# Patient Record
Sex: Female | Born: 1965 | Hispanic: No | Marital: Single | Smoking: Never smoker
Health system: Southern US, Community
[De-identification: ages and names within clinical notes are randomized; demographics above are authoritative.]

## PROBLEM LIST (undated history)

## (undated) DIAGNOSIS — R519 Headache, unspecified: Secondary | ICD-10-CM

## (undated) DIAGNOSIS — J45909 Unspecified asthma, uncomplicated: Secondary | ICD-10-CM

## (undated) DIAGNOSIS — E7800 Pure hypercholesterolemia, unspecified: Secondary | ICD-10-CM

## (undated) DIAGNOSIS — R51 Headache: Secondary | ICD-10-CM

## (undated) DIAGNOSIS — G8929 Other chronic pain: Secondary | ICD-10-CM

## (undated) DIAGNOSIS — E039 Hypothyroidism, unspecified: Secondary | ICD-10-CM

## (undated) DIAGNOSIS — K5790 Diverticulosis of intestine, part unspecified, without perforation or abscess without bleeding: Secondary | ICD-10-CM

## (undated) HISTORY — PX: SLEEVE GASTROPLASTY: SHX1101

## (undated) HISTORY — PX: OTHER SURGICAL HISTORY: SHX169

## (undated) HISTORY — DX: Headache, unspecified: R51.9

## (undated) HISTORY — DX: Pure hypercholesterolemia, unspecified: E78.00

## (undated) HISTORY — DX: Other chronic pain: G89.29

## (undated) HISTORY — DX: Unspecified asthma, uncomplicated: J45.909

## (undated) HISTORY — PX: BARTHOLIN CYST MARSUPIALIZATION: SHX5383

## (undated) HISTORY — DX: Diverticulosis of intestine, part unspecified, without perforation or abscess without bleeding: K57.90

## (undated) HISTORY — DX: Hypothyroidism, unspecified: E03.9

## (undated) HISTORY — DX: Headache: R51

---

## 2006-03-04 ENCOUNTER — Other Ambulatory Visit: Admission: RE | Admit: 2006-03-04 | Discharge: 2006-03-04 | Payer: Self-pay | Admitting: Gynecology

## 2007-03-29 ENCOUNTER — Other Ambulatory Visit: Admission: RE | Admit: 2007-03-29 | Discharge: 2007-03-29 | Payer: Self-pay | Admitting: Gynecology

## 2008-12-01 ENCOUNTER — Ambulatory Visit: Payer: Self-pay | Admitting: Women's Health

## 2008-12-01 ENCOUNTER — Encounter: Payer: Self-pay | Admitting: Women's Health

## 2008-12-01 ENCOUNTER — Other Ambulatory Visit: Admission: RE | Admit: 2008-12-01 | Discharge: 2008-12-01 | Payer: Self-pay | Admitting: Gynecology

## 2008-12-29 ENCOUNTER — Ambulatory Visit: Payer: Self-pay | Admitting: Women's Health

## 2009-06-28 ENCOUNTER — Encounter: Admission: RE | Admit: 2009-06-28 | Discharge: 2009-06-28 | Payer: Self-pay | Admitting: Nurse Practitioner

## 2010-03-11 ENCOUNTER — Ambulatory Visit: Payer: Self-pay | Admitting: Women's Health

## 2010-03-11 ENCOUNTER — Other Ambulatory Visit: Admission: RE | Admit: 2010-03-11 | Discharge: 2010-03-11 | Payer: Self-pay | Admitting: Gynecology

## 2010-03-22 ENCOUNTER — Ambulatory Visit: Payer: Self-pay | Admitting: Gynecology

## 2010-04-24 ENCOUNTER — Ambulatory Visit
Admission: RE | Admit: 2010-04-24 | Discharge: 2010-04-24 | Payer: Self-pay | Source: Home / Self Care | Attending: Women's Health | Admitting: Women's Health

## 2010-08-22 ENCOUNTER — Ambulatory Visit (INDEPENDENT_AMBULATORY_CARE_PROVIDER_SITE_OTHER): Payer: Managed Care, Other (non HMO) | Admitting: Women's Health

## 2010-08-22 DIAGNOSIS — R350 Frequency of micturition: Secondary | ICD-10-CM

## 2010-08-22 DIAGNOSIS — N898 Other specified noninflammatory disorders of vagina: Secondary | ICD-10-CM

## 2010-08-22 DIAGNOSIS — B373 Candidiasis of vulva and vagina: Secondary | ICD-10-CM

## 2010-08-22 DIAGNOSIS — R82998 Other abnormal findings in urine: Secondary | ICD-10-CM

## 2012-01-07 ENCOUNTER — Other Ambulatory Visit (HOSPITAL_COMMUNITY)
Admission: RE | Admit: 2012-01-07 | Discharge: 2012-01-07 | Disposition: A | Discharge status: Home / Self Care | Payer: 59 | Source: Ambulatory Visit | Attending: Women's Health | Admitting: Women's Health

## 2012-01-07 ENCOUNTER — Ambulatory Visit (INDEPENDENT_AMBULATORY_CARE_PROVIDER_SITE_OTHER): Payer: 59 | Admitting: Women's Health

## 2012-01-07 ENCOUNTER — Encounter: Payer: Self-pay | Admitting: Women's Health

## 2012-01-07 VITALS — BP 108/78 | Ht 65.0 in | Wt 284.0 lb

## 2012-01-07 DIAGNOSIS — Z01419 Encounter for gynecological examination (general) (routine) without abnormal findings: Secondary | ICD-10-CM | POA: Insufficient documentation

## 2012-01-07 DIAGNOSIS — Z6841 Body Mass Index (BMI) 40.0 and over, adult: Secondary | ICD-10-CM

## 2012-01-07 DIAGNOSIS — B009 Herpesviral infection, unspecified: Secondary | ICD-10-CM

## 2012-01-07 DIAGNOSIS — E039 Hypothyroidism, unspecified: Secondary | ICD-10-CM

## 2012-01-07 DIAGNOSIS — E7800 Pure hypercholesterolemia, unspecified: Secondary | ICD-10-CM | POA: Insufficient documentation

## 2012-01-07 DIAGNOSIS — Z1151 Encounter for screening for human papillomavirus (HPV): Secondary | ICD-10-CM | POA: Insufficient documentation

## 2012-01-07 NOTE — Patient Instructions (Signed)

## 2012-01-07 NOTE — Progress Notes (Signed)
Erika Robles [birth year] [id number]    History:    The patient presents for annual exam.  Postmenopausal with no bleeding for greater than 4 months, history of an elevated FSH in 2011. Hypothyroid on Synthroid 88 mcg and hypercholesteremia on Simvastin per primary care. 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 History of normal Paps prior. History of normal mammograms. History of a posterior intramural fibroid 24 x 18 mm 04/2010. 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   Past medical history, past surgical history, family history and social history were all reviewed and documented in the EPIC chart. Supervisor at a call center. 2 sisters with diabetes, mother and sister with hypertension. Son [place] 20 doing well.   ROS:  A  ROS was performed and pertinent positives and negatives are included in the history.  Exam:  Filed Vitals:   01/07/12 0933  BP: 108/78    General appearance:  Normal Head/Neck:  Normal, without cervical or supraclavicular adenopathy. Thyroid:  Symmetrical, normal in size, without palpable masses or nodularity. Respiratory  Effort:  Normal  Auscultation:  Clear without wheezing or rhonchi Cardiovascular  Auscultation:  Regular rate, without rubs, murmurs or gallops  Edema/varicosities:  Not grossly evident Abdominal  Soft,nontender, without masses, guarding or rebound.  Liver/spleen:  No organomegaly noted  Hernia:  None appreciated  Skin  Inspection:  Grossly normal  Palpation:  Grossly normal Neurologic/psychiatric  Orientation:  Normal with appropriate conversation.  Mood/affect:  Normal  Genitourinary    Breasts: Examined lying and sitting.     Right: Without masses, retractions, discharge or axillary adenopathy.     Left: Without masses, retractions, discharge or axillary adenopathy.   Inguinal/mons:  Normal without inguinal adenopathy  External genitalia:  Normal  BUS/Urethra/Skene's  glands:  Normal  Bladder:  Normal  Vagina:  Normal  Cervix:  Normal  Uterus:   normal in size, shape and contour.  Midline and mobile  Adnexa/parametria:     Rt: Without masses or tenderness.   Lt: Without masses or tenderness.  Anus and perineum: Normal  Digital rectal exam: Normal sphincter tone without palpated masses or tenderness  Assessment/Plan:  46 y.o. SBF G2 P1 for annual exam with no complaints.  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 SBE's, annual mammogram, increase exercise and decrease calories for weight loss, discussed Weight [employer]. Pap with HR HPV. Reviewed importance of annual exams.    Harrington Challenger [facility], 12:39 PM 01/07/2012

## 2012-01-12 ENCOUNTER — Encounter: Payer: Self-pay | Admitting: Women's Health

## 2012-05-26 ENCOUNTER — Ambulatory Visit (INDEPENDENT_AMBULATORY_CARE_PROVIDER_SITE_OTHER): Payer: 59 | Admitting: Women's Health

## 2012-05-26 ENCOUNTER — Encounter: Payer: Self-pay | Admitting: Women's Health

## 2012-05-26 DIAGNOSIS — N390 Urinary tract infection, site not specified: Secondary | ICD-10-CM | POA: Insufficient documentation

## 2012-05-26 DIAGNOSIS — R300 Dysuria: Secondary | ICD-10-CM

## 2012-05-26 LAB — URINALYSIS W MICROSCOPIC + REFLEX CULTURE
Glucose, UA: NEGATIVE mg/dL
Protein, ur: 100 mg/dL — AB
Urobilinogen, UA: 0.2 mg/dL (ref 0.0–1.0)

## 2012-05-26 MED ORDER — SULFAMETHOXAZOLE-TRIMETHOPRIM 800-160 MG PO TABS: 1.0000 | ORAL_TABLET | Freq: Two times a day (BID) | ORAL | Status: DC

## 2012-05-26 NOTE — Patient Instructions (Addendum)
Urinary Tract Infection Urinary tract infections (UTIs) can develop anywhere along your urinary tract. Your urinary tract is your body's drainage system for removing wastes and extra water. Your urinary tract includes two kidneys, two ureters, a bladder, and a urethra. Your kidneys are a pair of bean-shaped organs. Each kidney is about the size of your fist. They are located below your ribs, one on each side of your spine. CAUSES Infections are caused by microbes, which are microscopic organisms, including fungi, viruses, and bacteria. These organisms are so small that they can only be seen through a microscope. Bacteria are the microbes that most commonly cause UTIs. SYMPTOMS  Symptoms of UTIs may vary by age and gender of the patient and by the location of the infection. Symptoms in Giovonni Poirier women typically include a frequent and intense urge to urinate and a painful, burning feeling in the bladder or urethra during urination. Older women and men are more likely to be tired, shaky, and weak and have muscle aches and abdominal pain. A fever may mean the infection is in your kidneys. Other symptoms of a kidney infection include pain in your back or sides below the ribs, nausea, and vomiting. DIAGNOSIS To diagnose a UTI, your caregiver will ask you about your symptoms. Your caregiver also will ask to provide a urine sample. The urine sample will be tested for bacteria and white blood cells. White blood cells are made by your body to help fight infection. TREATMENT  Typically, UTIs can be treated with medication. Because most UTIs are caused by a bacterial infection, they usually can be treated with the use of antibiotics. The choice of antibiotic and length of treatment depend on your symptoms and the type of bacteria causing your infection. HOME CARE INSTRUCTIONS  If you were prescribed antibiotics, take them exactly as your caregiver instructs you. Finish the medication even if you feel better after you  have only taken some of the medication.  Drink enough water and fluids to keep your urine clear or pale yellow.  Avoid caffeine, tea, and carbonated beverages. They tend to irritate your bladder.  Empty your bladder often. Avoid holding urine for long periods of time.  Empty your bladder before and after sexual intercourse.  After a bowel movement, women should cleanse from front to back. Use each tissue only once. SEEK MEDICAL CARE IF:   You have back pain.  You develop a fever.  Your symptoms do not begin to resolve within 3 days. SEEK IMMEDIATE MEDICAL CARE IF:   You have severe back pain or lower abdominal pain.  You develop chills.  You have nausea or vomiting.  You have continued burning or discomfort with urination. MAKE SURE YOU:   Understand these instructions.  Will watch your condition.  Will get help right away if you are not doing well or get worse. Document Released: 01/15/2005 Document Revised: 10/07/2011 Document Reviewed: 05/16/2011 ExitCare Patient Information 2013 [employer].  

## 2012-05-26 NOTE — Progress Notes (Signed)
Patient ID: Erika Robles, female   DOB: Apr 26, 1965, 47 y.o.   MRN: [id number] Presents with complaint of increased urinary frequency, pressure and slight discomfort at end of stream of urination for 2 days. Reports different odor with urine. Tried cranberry juice, cranberry capsules and cystex without relief. Denies any vaginal discharge or fever. Not sexually active.  Exam: Appears well, no CVAT, UA: Trace leukocytes, 11-20 WBCs, 21-50 RBCs and many bacteria.  UTI with hematuria  Plan: Septra DS twice daily for 3 days #6, prescription, proper use given and reviewed, urine culture pending. Return to office in 2 weeks for test of cure UA due to hematuria. Instructed to call if symptoms persist. UTI prevention discussed.

## 2012-05-28 ENCOUNTER — Other Ambulatory Visit: Payer: Self-pay | Admitting: Women's Health

## 2012-05-28 DIAGNOSIS — R319 Hematuria, unspecified: Secondary | ICD-10-CM

## 2012-05-28 LAB — URINE CULTURE
Colony Count: NO GROWTH
Organism ID, Bacteria: NO GROWTH

## 2012-06-09 ENCOUNTER — Other Ambulatory Visit: Payer: 59

## 2012-06-10 ENCOUNTER — Other Ambulatory Visit: Payer: 59

## 2012-06-11 LAB — URINALYSIS W MICROSCOPIC + REFLEX CULTURE
Bacteria, UA: NONE SEEN
Hgb urine dipstick: NEGATIVE
Ketones, ur: NEGATIVE mg/dL
Leukocytes, UA: NEGATIVE
Nitrite: NEGATIVE
Specific Gravity, Urine: 1.02 (ref 1.005–1.030)
Urobilinogen, UA: 0.2 mg/dL (ref 0.0–1.0)

## 2012-08-30 ENCOUNTER — Ambulatory Visit (INDEPENDENT_AMBULATORY_CARE_PROVIDER_SITE_OTHER): Payer: 59 | Admitting: Women's Health

## 2012-08-30 DIAGNOSIS — R102 Pelvic and perineal pain unspecified side: Secondary | ICD-10-CM

## 2012-08-30 DIAGNOSIS — N390 Urinary tract infection, site not specified: Secondary | ICD-10-CM

## 2012-08-30 DIAGNOSIS — N9489 Other specified conditions associated with female genital organs and menstrual cycle: Secondary | ICD-10-CM

## 2012-08-30 LAB — URINALYSIS W MICROSCOPIC + REFLEX CULTURE
Casts: NONE SEEN
Crystals: NONE SEEN
Glucose, UA: NEGATIVE mg/dL
Nitrite: NEGATIVE
Specific Gravity, Urine: 1.02 (ref 1.005–1.030)
pH: 6 (ref 5.0–8.0)

## 2012-08-30 MED ORDER — SULFAMETHOXAZOLE-TRIMETHOPRIM 800-160 MG PO TABS: 1.0000 | ORAL_TABLET | Freq: Two times a day (BID) | ORAL | Status: DC

## 2012-08-30 NOTE — Progress Notes (Signed)
Patient ID: Erika Robles, female   DOB: Sep 11, 1965, 47 y.o.   MRN: [id number] Presents with complaints of frequency, slight discomfort and pressure with urination and left lower back sided pain for last 5 days. Denies burning, vaginal discharge, or fever. UTI February 2014, septra, resolved. Sexually active/same partner. Perimenopausal, irregular cycles, with ocasional cramping,  LMP Jan 2014, minimal menopausal symptoms.    Exam: Appears well. No CVA tenderness. UA: Trace leukocyte esterases, 7-10 WBC, few bacteria.  UTI Obesity Perimenopausal  Plan: Bactrim DS 800-160 mg twice daily for 3 days prescription, proper use reviewed. Urine culture pending. UTI prevention discussed. Instructed to call if any further bleeding. Encouraged healthy diet and exercise for weight loss and health.

## 2012-09-01 LAB — URINE CULTURE
Colony Count: NO GROWTH
Organism ID, Bacteria: NO GROWTH

## 2012-09-02 ENCOUNTER — Telehealth: Payer: Self-pay | Admitting: *Deleted

## 2012-09-02 NOTE — Telephone Encounter (Signed)
Telephone call, states is feeling somewhat better but not 100 percent. States has been constipated but did have a bowel movement today. Will watch at this time if still continues to not feel well will call back, and we will schedule an ultrasound. Denies any vaginal discharge,  nausea/vomiting or fever

## 2012-09-02 NOTE — Telephone Encounter (Signed)
Pt was given rx for Bactrim DS 800-160 mg twice daily for 3 days on 08/30/12 she took rx, culture came back with no growth. Pt said she still feels funny in abdomen area, feeling pressure and discomfort. # [id number] if needed. Pt said she felt great the next day after taking 1st dose of pills. Please advise

## 2012-09-06 ENCOUNTER — Inpatient Hospital Stay (HOSPITAL_COMMUNITY)
Admission: AD | Admit: 2012-09-06 | Discharge: 2012-09-06 | Disposition: A | Discharge status: Home / Self Care | Payer: 59 | Attending: Obstetrics & Gynecology | Admitting: Obstetrics & Gynecology

## 2012-09-06 ENCOUNTER — Encounter (HOSPITAL_COMMUNITY): Payer: Self-pay | Admitting: *Deleted

## 2012-09-06 DIAGNOSIS — R300 Dysuria: Secondary | ICD-10-CM | POA: Insufficient documentation

## 2012-09-06 DIAGNOSIS — R350 Frequency of micturition: Secondary | ICD-10-CM | POA: Insufficient documentation

## 2012-09-06 DIAGNOSIS — N390 Urinary tract infection, site not specified: Secondary | ICD-10-CM | POA: Insufficient documentation

## 2012-09-06 LAB — URINALYSIS, ROUTINE W REFLEX MICROSCOPIC
Ketones, ur: NEGATIVE mg/dL
Nitrite: POSITIVE — AB
Specific Gravity, Urine: 1.015 (ref 1.005–1.030)
Urobilinogen, UA: 0.2 mg/dL (ref 0.0–1.0)
pH: 6 (ref 5.0–8.0)

## 2012-09-06 LAB — WET PREP, GENITAL: Clue Cells Wet Prep HPF POC: NONE SEEN

## 2012-09-06 LAB — POCT PREGNANCY, URINE: Preg Test, Ur: NEGATIVE

## 2012-09-06 LAB — URINE MICROSCOPIC-ADD ON

## 2012-09-06 MED ORDER — PHENAZOPYRIDINE HCL 200 MG PO TABS: 200.0000 mg | ORAL_TABLET | Freq: Three times a day (TID) | ORAL | Status: DC

## 2012-09-06 MED ORDER — CIPROFLOXACIN HCL 500 MG PO TABS: 500.0000 mg | ORAL_TABLET | Freq: Two times a day (BID) | ORAL | Status: DC

## 2012-09-06 NOTE — MAU Note (Signed)
Pt reports for the last few weeks, she has been constipated. For the last week pt has been feeling like she had a bladder infection, frequency, pain.

## 2012-09-06 NOTE — MAU Provider Note (Signed)
History     CSN: [id number]  Arrival date and time: 09/06/12 0430   First Provider Initiated Contact with Patient 09/06/12 [phone]      Chief Complaint  Patient presents with  . Dysuria  . Urinary Frequency   HPI Erika Robles is a 47 y.o. G1P1001 who presents to MAU today with complaint of urinary discomfort, urgency and frequency. The patient states that she feels a lot of pressure in the lower pelvis. She has also been urinating up to 20 times in a day over the last 3 days. She was seen by her PCP on Monday of last week for the same symptoms and given Bactrim x 3 days. She completed the medication, but has only noticed worsening of her symptoms. She denies fever, CVA tenderness, vaginal bleeding, hematuria or vaginal discharge.   OB History   Grav Para Term Preterm Abortions TAB SAB Ect Mult Living   1 1 1       1       Past Medical History  Diagnosis Date  . Thyroid disease     History reviewed. No pertinent past surgical history.  Family History  Problem Relation Age of Onset  . Hypertension Mother   . Diabetes Sister   . Hypertension Sister     History  Substance Use Topics  . Smoking status: Former Smoker    Types: Cigarettes  . Smokeless tobacco: Not on file  . Alcohol Use: Yes    Allergies: No Known Allergies  Prescriptions prior to admission  Medication Sig Dispense Refill  . ADDERALL XR 15 MG 24 hr capsule       . albuterol (PROVENTIL HFA;VENTOLIN HFA) 108 (90 BASE) MCG/ACT inhaler Inhale 2 puffs into the lungs every 6 (six) hours as needed.      Marland Kitchen levothyroxine (SYNTHROID, LEVOTHROID) 88 MCG tablet       . simvastatin (ZOCOR) 20 MG tablet       . VALTREX 500 MG tablet       . [DISCONTINUED] sulfamethoxazole-trimethoprim (BACTRIM DS) 800-160 MG per tablet Take 1 tablet by mouth 2 (two) times daily.  6 tablet  0    Review of Systems  Constitutional: Negative for fever and malaise/fatigue.  Gastrointestinal: Positive for abdominal pain and  constipation. Negative for nausea, vomiting and diarrhea.  Genitourinary: Positive for dysuria, urgency and frequency. Negative for hematuria and flank pain.       Neg - vaginal bleeding, discharge  Musculoskeletal: Negative for back pain.   Physical Exam   Blood pressure 134/94, pulse 94, temperature 98.4 F (36.9 C), temperature source Oral, resp. rate 18, height 5\' 5"  (1.651 m), weight 286 lb (129.729 kg), last menstrual period 05/06/2012, SpO2 100.00%.  Physical Exam  Constitutional: She is oriented to person, place, and time. She appears well-developed and well-nourished. No distress.  HENT:  Head: Normocephalic and atraumatic.  Cardiovascular: Normal rate, regular rhythm and normal heart sounds.   Respiratory: Effort normal and breath sounds normal. No respiratory distress.  GI: Soft. Bowel sounds are normal. She exhibits no distension and no mass. There is tenderness (mild tenderness to palpation of the lower abdomen). There is no rebound, no guarding and no CVA tenderness.  Genitourinary: There is no rash or tenderness on the right labia. There is no rash or tenderness on the left labia. Uterus is not enlarged and not tender. Cervix exhibits no motion tenderness, no discharge and no friability. Right adnexum displays no mass and no tenderness. Left adnexum displays  no mass and no tenderness. No bleeding around the vagina. No vaginal discharge found.  Neurological: She is alert and oriented to person, place, and time.  Skin: Skin is warm. No erythema.  Psychiatric: She has a normal mood and affect.   Results for orders placed during the hospital encounter of 09/06/12 (from the past 24 hour(s))  URINALYSIS, ROUTINE W REFLEX MICROSCOPIC     Status: Abnormal   Collection Time    09/06/12  4:52 AM      Result Value Range   Color, Urine YELLOW  YELLOW   APPearance TURBID (*) CLEAR   Specific Gravity, Urine 1.015  1.005 - 1.030   pH 6.0  5.0 - 8.0   Glucose, UA NEGATIVE  NEGATIVE  mg/dL   Hgb urine dipstick MODERATE (*) NEGATIVE   Bilirubin Urine NEGATIVE  NEGATIVE   Ketones, ur NEGATIVE  NEGATIVE mg/dL   Protein, ur NEGATIVE  NEGATIVE mg/dL   Urobilinogen, UA 0.2  0.0 - 1.0 mg/dL   Nitrite POSITIVE (*) NEGATIVE   Leukocytes, UA MODERATE (*) NEGATIVE  URINE MICROSCOPIC-ADD ON     Status: Abnormal   Collection Time    09/06/12  4:52 AM      Result Value Range   Squamous Epithelial / LPF FEW (*) RARE   WBC, UA TOO NUMEROUS TO COUNT  <3 WBC/hpf   RBC / HPF 0-2  <3 RBC/hpf   Bacteria, UA MANY (*) RARE  WET PREP, GENITAL     Status: Abnormal   Collection Time    09/06/12  5:40 AM      Result Value Range   Yeast Wet Prep HPF POC NONE SEEN  NONE SEEN   Trich, Wet Prep NONE SEEN  NONE SEEN   Clue Cells Wet Prep HPF POC NONE SEEN  NONE SEEN   WBC, Wet Prep HPF POC FEW (*) NONE SEEN    MAU Course  Procedures None  MDM UA, Wet prep, GC/Chlamydia today  Assessment and Plan  A: UTI  P: Discharge home Rx for Cipro and Pyridium given to the patient Patient encouraged to increase PO hydration Patient advised to follow-up with PCP if symptoms do not resolve Patient may return to MAU as needed or if her condition were to change or worsen  Erika Starr, PA-C  09/06/2012, 6:05 AM

## 2012-09-07 LAB — GC/CHLAMYDIA PROBE AMP: CT Probe RNA: NEGATIVE

## 2012-09-08 LAB — URINE CULTURE: Colony Count: >=100000

## 2012-10-29 ENCOUNTER — Ambulatory Visit: Payer: 59 | Admitting: Gynecology

## 2013-01-07 ENCOUNTER — Encounter: Payer: 59 | Admitting: Women's Health

## 2013-06-30 ENCOUNTER — Encounter: Payer: Self-pay | Admitting: Internal Medicine

## 2013-06-30 ENCOUNTER — Telehealth: Payer: Self-pay | Admitting: *Deleted

## 2013-06-30 ENCOUNTER — Encounter: Payer: Self-pay | Admitting: Women's Health

## 2013-06-30 ENCOUNTER — Other Ambulatory Visit (HOSPITAL_COMMUNITY)
Admission: RE | Admit: 2013-06-30 | Discharge: 2013-06-30 | Disposition: A | Discharge status: Home / Self Care | Payer: 59 | Source: Ambulatory Visit | Attending: Gynecology | Admitting: Gynecology

## 2013-06-30 ENCOUNTER — Ambulatory Visit (INDEPENDENT_AMBULATORY_CARE_PROVIDER_SITE_OTHER): Payer: 59 | Admitting: Women's Health

## 2013-06-30 VITALS — BP 128/82 | Ht 65.0 in | Wt 291.8 lb

## 2013-06-30 DIAGNOSIS — R1032 Left lower quadrant pain: Secondary | ICD-10-CM

## 2013-06-30 DIAGNOSIS — N926 Irregular menstruation, unspecified: Secondary | ICD-10-CM

## 2013-06-30 DIAGNOSIS — G8929 Other chronic pain: Secondary | ICD-10-CM

## 2013-06-30 DIAGNOSIS — R1011 Right upper quadrant pain: Principal | ICD-10-CM

## 2013-06-30 DIAGNOSIS — Z01419 Encounter for gynecological examination (general) (routine) without abnormal findings: Secondary | ICD-10-CM

## 2013-06-30 NOTE — Telephone Encounter (Signed)
Pt informed that appointment on 07/19/13 @ 9:00 am with Dr.Gessner, Dr.Prytle next appointment for new patient was in May.

## 2013-06-30 NOTE — Progress Notes (Signed)
Erika Robles 31-Dec-1965 [id number]    History:    Presents for annual exam.  Cycle every 3-4 months past year, cycles have become increasingly irregular over past several years with increased menopausal symptoms. 2011 LGSIL with negative C&B and ECC. Marland Kitchen Normal mammogram history. Rare HSV outbreaks.  Past medical history, past surgical history, family history and social history were all reviewed and documented in the EPIC chart. Supervisor at a call center. Erika Robles 20 doing well graduating from [employer]. 2 sisters with diabetes. Sister and mother with hypertension.  ROS:  A  ROS was performed and pertinent positives and negatives are included.  Exam:  Filed Vitals:   06/30/13 1105  BP: 128/82    General appearance:  Normal Thyroid:  Symmetrical, normal in size, without palpable masses or nodularity. Respiratory  Auscultation:  Clear without wheezing or rhonchi Cardiovascular  Auscultation:  Regular rate, without rubs, murmurs or gallops  Edema/varicosities:  Not grossly evident Abdominal  Soft,nontender, without masses, guarding or rebound.  Liver/spleen:  No organomegaly noted  Hernia:  None appreciated  Skin  Inspection:  Grossly normal   Breasts: Examined lying and sitting.     Right: Without masses, retractions, discharge or axillary adenopathy.     Left: Without masses, retractions, discharge or axillary adenopathy. Gentitourinary   Inguinal/mons:  Normal without inguinal adenopathy  External genitalia:  Normal  BUS/Urethra/Skene's glands:  Normal  Vagina:  Normal  Cervix:  Normal  Uterus:   normal in size, shape and contour.  Midline and mobile  Adnexa/parametria:     Rt: Without masses or tenderness.   Lt: Without masses or tenderness.  Anus and perineum: Normal  Digital rectal exam: Normal sphincter tone without palpated masses or tenderness  Assessment/Plan:  48 y.o. SBF G1 P1  for annual exam with complaint of right upper quadrant pain under breast, and left  lower quadrant discomfort for many months, intermittent in nature..  Obesity Questionable gallbladder disease Hypothyroid/hypercholesterolemia-primary care manages labs and meds Irregular cycles.  Plan: FSH, UA. Pap,  new screening guidelines reviewed. Referral to GI for evaluation of questionable gallbladder disease and lower left quadrant discomfort. SBE's, continue annual mammogram, calcium rich diet, MVI daily encouraged. Reviewed importance of regular exercise and decreasing calories for weight loss. Condoms encouraged if becomes sexually active.    Huel Cote [facility], 12:57 PM 06/30/2013

## 2013-06-30 NOTE — Patient Instructions (Signed)
Health Recommendations for Postmenopausal Women Respected and ongoing research has looked at the most common causes of death, disability, and poor quality of life in postmenopausal women. The causes include heart disease, diseases of blood vessels, diabetes, depression, cancer, and bone loss (osteoporosis). Many things can be done to help lower the chances of developing these and other common problems: CARDIOVASCULAR DISEASE Heart Disease: A heart attack is a medical emergency. Know the signs and symptoms of a heart attack. Below are things women can do to reduce their risk for heart disease.   Do not smoke. If you smoke, quit.  Aim for a healthy weight. Being overweight causes many preventable deaths. Eat a healthy and balanced diet and drink an adequate amount of liquids.  Get moving. Make a commitment to be more physically active. Aim for 30 minutes of activity on most, if not all days of the week.  Eat for heart health. Choose a diet that is low in saturated fat and cholesterol and eliminate trans fat. Include whole grains, vegetables, and fruits. Read and understand the labels on food containers before buying.  Know your numbers. Ask your caregiver to check your blood pressure, cholesterol (total, HDL, LDL, triglycerides) and blood glucose. Work with your caregiver on improving your entire clinical picture.  High blood pressure. Limit or stop your table salt intake (try salt substitute and food seasonings). Avoid salty foods and drinks. Read labels on food containers before buying. Eating well and exercising can help control high blood pressure. STROKE  Stroke is a medical emergency. Stroke may be the result of a blood clot in a blood vessel in the brain or by a brain hemorrhage (bleeding). Know the signs and symptoms of a stroke. To lower the risk of developing a stroke:  Avoid fatty foods.  Quit smoking.  Control your diabetes, blood pressure, and irregular heart rate. THROMBOPHLEBITIS  (BLOOD CLOT) OF THE LEG  Becoming overweight and leading a stationary lifestyle may also contribute to developing blood clots. Controlling your diet and exercising will help lower the risk of developing blood clots. CANCER SCREENING  Breast Cancer: Take steps to reduce your risk of breast cancer.  You should practice "breast self-awareness." This means understanding the normal appearance and feel of your breasts and should include breast self-examination. Any changes detected, no matter how small, should be reported to your caregiver.  After age 40, you should have a clinical breast exam (CBE) every year.  Starting at age 40, you should consider having a mammogram (breast X-ray) every year.  If you have a family history of breast cancer, talk to your caregiver about genetic screening.  If you are at high risk for breast cancer, talk to your caregiver about having an MRI and a mammogram every year.  Intestinal or Stomach Cancer: Tests to consider are a rectal exam, fecal occult blood, sigmoidoscopy, and colonoscopy. Women who are high risk may need to be screened at an earlier age and more often.  Cervical Cancer:  Beginning at age 30, you should have a Pap test every 3 years as long as the past 3 Pap tests have been normal.  If you have had past treatment for cervical cancer or a condition that could lead to cancer, you need Pap tests and screening for cancer for at least 20 years after your treatment.  If you had a hysterectomy for a problem that was not cancer or a condition that could lead to cancer, then you no longer need Pap tests.    If you are between ages 65 and 70, and you have had normal Pap tests going back 10 years, you no longer need Pap tests.  If Pap tests have been discontinued, risk factors (such as a new sexual partner) need to be reassessed to determine if screening should be resumed.  Some medical problems can increase the chance of getting cervical cancer. In these  cases, your caregiver may recommend more frequent screening and Pap tests.  Uterine Cancer: If you have vaginal bleeding after reaching menopause, you should notify your caregiver.  Ovarian cancer: Other than yearly pelvic exams, there are no reliable tests available to screen for ovarian cancer at this time except for yearly pelvic exams.  Lung Cancer: Yearly chest X-rays can detect lung cancer and should be done on high risk women, such as cigarette smokers and women with chronic lung disease (emphysema).  Skin Cancer: A complete body skin exam should be done at your yearly examination. Avoid overexposure to the sun and ultraviolet light lamps. Use a strong sun block cream when in the sun. All of these things are important in lowering the risk of skin cancer. MENOPAUSE Menopause Symptoms: Hormone therapy products are effective for treating symptoms associated with menopause:  Moderate to severe hot flashes.  Night sweats.  Mood swings.  Headaches.  Tiredness.  Loss of sex drive.  Insomnia.  Other symptoms. Hormone replacement carries certain risks, especially in older women. Women who use or are thinking about using estrogen or estrogen with progestin treatments should discuss that with their caregiver. Your caregiver will help you understand the benefits and risks. The ideal dose of hormone replacement therapy is not known. The Food and Drug Administration (FDA) has concluded that hormone therapy should be used only at the lowest doses and for the shortest amount of time to reach treatment goals.  OSTEOPOROSIS Protecting Against Bone Loss and Preventing Fracture: If you use hormone therapy for prevention of bone loss (osteoporosis), the risks for bone loss must outweigh the risk of the therapy. Ask your caregiver about other medications known to be safe and effective for preventing bone loss and fractures. To guard against bone loss or fractures, the following is recommended:  If  you are less than age 50, take 1000 mg of calcium and at least 600 mg of Vitamin D per day.  If you are greater than age 50 but less than age 70, take 1200 mg of calcium and at least 600 mg of Vitamin D per day.  If you are greater than age 70, take 1200 mg of calcium and at least 800 mg of Vitamin D per day. Smoking and excessive alcohol intake increases the risk of osteoporosis. Eat foods rich in calcium and vitamin D and do weight bearing exercises several times a week as your caregiver suggests. DIABETES Diabetes Melitus: If you have Type I or Type 2 diabetes, you should keep your blood sugar under control with diet, exercise and recommended medication. Avoid too many sweets, starchy and fatty foods. Being overweight can make control more difficult. COGNITION AND MEMORY Cognition and Memory: Menopausal hormone therapy is not recommended for the prevention of cognitive disorders such as Alzheimer's disease or memory loss.  DEPRESSION  Depression may occur at any age, but is common in elderly women. The reasons may be because of physical, medical, social (loneliness), or financial problems and needs. If you are experiencing depression because of medical problems and control of symptoms, talk to your caregiver about this. Physical activity and   exercise may help with mood and sleep. Community and volunteer involvement may help your sense of value and worth. If you have depression and you feel that the problem is getting worse or becoming severe, talk to your caregiver about treatment options that are best for you. ACCIDENTS  Accidents are common and can be serious in the elderly woman. Prepare your house to prevent accidents. Eliminate throw rugs, place hand bars in the bath, shower and toilet areas. Avoid wearing high heeled shoes or walking on wet, snowy, and icy areas. Limit or stop driving if you have vision or hearing problems, or you feel you are unsteady with you movements and  reflexes. HEPATITIS C Hepatitis C is a type of viral infection affecting the liver. It is spread mainly through contact with blood from an infected person. It can be treated, but if left untreated, it can lead to severe liver damage over years. Many people who are infected do not know that the virus is in their blood. If you are a "baby-boomer", it is recommended that you have one screening test for Hepatitis C. IMMUNIZATIONS  Several immunizations are important to consider having during your senior years, including:   Tetanus, diptheria, and pertussis booster shot.  Influenza every year before the flu season begins.  Pneumonia vaccine.  Shingles vaccine.  Others as indicated based on your specific needs. Talk to your caregiver about these. Document Released: 05/30/2005 Document Revised: 03/24/2012 Document Reviewed: 01/24/2008 Exit[employer] Patient Information 2014 [employer].  

## 2013-06-30 NOTE — Telephone Encounter (Signed)
Message copied by Thamas Jaegers on Thu Jun 30, 2013  2:19 PM ------      Message from: [place], [place] J      Created: Thu Jun 30, 2013 12:00 PM       Please schedule appt with Dr Hilarie Fredrickson at [facility] for eval of rt upper pain possible GB, and possible colonoscopy for llq discomfort months. ------

## 2013-07-01 ENCOUNTER — Telehealth: Payer: Self-pay | Admitting: Women's Health

## 2013-07-01 LAB — FOLLICLE STIMULATING HORMONE: FSH: 39.4 m[IU]/mL

## 2013-07-01 NOTE — Telephone Encounter (Signed)
Message on cell [facility] elevated/menopausal range, may not have anymore cycles. Has had irregular cycles this past year.

## 2013-07-19 ENCOUNTER — Ambulatory Visit (INDEPENDENT_AMBULATORY_CARE_PROVIDER_SITE_OTHER): Payer: 59 | Admitting: Internal Medicine

## 2013-07-19 ENCOUNTER — Encounter: Payer: Self-pay | Admitting: Internal Medicine

## 2013-07-19 VITALS — BP 118/80 | HR 88 | Ht 65.0 in | Wt 289.1 lb

## 2013-07-19 DIAGNOSIS — R198 Other specified symptoms and signs involving the digestive system and abdomen: Secondary | ICD-10-CM

## 2013-07-19 DIAGNOSIS — R194 Change in bowel habit: Secondary | ICD-10-CM

## 2013-07-19 DIAGNOSIS — R1011 Right upper quadrant pain: Secondary | ICD-10-CM

## 2013-07-19 MED ORDER — NA SULFATE-K SULFATE-MG SULF 17.5-3.13-1.6 GM/177ML PO SOLN: ORAL | Status: DC

## 2013-07-19 NOTE — Progress Notes (Signed)
Subjective:    Patient ID: Erika Robles, female    DOB: Feb 02, 1966, 48 y.o.   MRN: [id number]  HPI This is a very nice lady who has been having some right-sided abdominal pain. She's had some tenderness there felt on recent GYN exam. GI referral was made. The patient reports that she does have constipation and progressively more narrow stools over the last couple of years with some left lower quadrant pain and frequent noises in that area with bowel sounds. She tried some citrated magnesia recently when she didn't go to the bathroom for about 5 days. She has not really using a regular laxative otherwise. She has increased fiber in her diet. The right upper quadrant pain is intermittent for the last year, it is not necessarily postprandial. It does not seem to radiate to the back. She says that when she has increased fiber to a point, or even tried a fiber supplement like Citrucel she actually got constipated. She has not had any rectal bleeding. Sometimes she has a pain or pressure radiating up into her chest. No Known Allergies Outpatient Prescriptions Prior to Visit  Medication Sig Dispense Refill  . ADDERALL XR 15 MG 24 hr capsule Take 15 mg by mouth daily.       Marland Kitchen albuterol (PROVENTIL HFA;VENTOLIN HFA) 108 (90 BASE) MCG/ACT inhaler Inhale 2 puffs into the lungs every 6 (six) hours as needed.      Marland Kitchen levothyroxine (SYNTHROID, LEVOTHROID) 100 MCG tablet Take 100 mcg by mouth daily before breakfast.       No facility-administered medications prior to visit.   Past Medical History  Diagnosis Date  . Hypothyroidism     hypothyroidism  . Obesity   . Hypercholesteremia   . HSV (herpes simplex virus) infection   . Chronic headaches    Past Surgical History  Procedure Laterality Date  . Bartholin cyst marsupialization     History   Social History  . Marital Status: Single    Spouse Name: N/A    Number of Children: 1  . Years of Education: N/A   Occupational History  .  manager [employer]   Social History Main Topics  . Smoking status: Former Smoker    Types: Cigarettes    Quit date: 04/21/1988  . Smokeless tobacco: Never Used  . Alcohol Use: Yes     Comment: 0-1 per day  . Drug Use: No  . 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 One caffeinated beverage daily. Updated 07/19/2013   Family History  Problem Relation Age of Onset  . Hypertension Mother   . Diabetes Sister   . Hypertension Sister   . Colon polyps Father   . Lung cancer Maternal Grandfather     smoker  . Prostate cancer Paternal Grandfather     Review of Systems Positive for those things mentioned above, allergies, back pain and some headaches. All other review of systems negative or as per history of present illness.    Objective:   Physical Exam General:  Well-developed, well-nourished and in no acute distress - very obese Eyes:  anicteric. ENT:   Mouth and posterior pharynx free of lesions.  Neck:   supple w/o thyromegaly or mass.  Lungs: Clear to auscultation bilaterally. Chest wall: Ribs non tender Heart:  S1S2, no rubs, murmurs, gallops. Abdomen:  soft, non-tender, no hepatosplenomegaly, hernia, or mass and BS+.  Rectal: deferred Lymph:  no cervical or supraclavicular adenopathy. Extremities:   no edema Skin   no rash. Neuro:  A&O x 3.  Psych:  appropriate mood and  Affect.   Data Reviewed: GYN notes    Assessment & Plan:  Change in bowel habits and some left lower quadrant pain  RUQ pain  Schedule colonoscopy to evaluate change in bowel habits with smaller stools and constipation. The risks and benefits as well as alternatives of endoscopic procedure(s) have been discussed and reviewed. All questions answered. The patient agrees to proceed.  Continue to increase fiber - add MiraLax if needed  EM:[id number],OFHQRF, MD and Elon Alas, NP

## 2013-07-19 NOTE — Patient Instructions (Addendum)
You have been scheduled for a colonoscopy with propofol. Please follow written instructions given to you at your visit today.  Please pick up your prep kit at the pharmacy within the next 1-3 days. If you use inhalers (even only as needed), please bring them with you on the day of your procedure. Your physician has requested that you go to www.startemmi.com and enter the access code given to you at your visit today. This web site gives a general overview about your procedure. However, you should still follow specific instructions given to you by our office regarding your preparation for the procedure.   Increase the fiber in your diet, if this doesn't help enough then add over the counter Miralax and use as needed.  Coupon provided today for the Miralax.   I appreciate the opportunity to care for you.

## 2013-07-25 ENCOUNTER — Encounter: Payer: Self-pay | Admitting: Internal Medicine

## 2013-08-26 ENCOUNTER — Encounter: Payer: Self-pay | Admitting: Internal Medicine

## 2013-08-26 ENCOUNTER — Ambulatory Visit (AMBULATORY_SURGERY_CENTER): Payer: 59 | Admitting: Internal Medicine

## 2013-08-26 VITALS — BP 121/71 | HR 88 | Temp 97.3°F | Resp 20 | Ht 65.0 in | Wt 289.0 lb

## 2013-08-26 DIAGNOSIS — R198 Other specified symptoms and signs involving the digestive system and abdomen: Secondary | ICD-10-CM

## 2013-08-26 DIAGNOSIS — K5730 Diverticulosis of large intestine without perforation or abscess without bleeding: Secondary | ICD-10-CM

## 2013-08-26 MED ORDER — SODIUM CHLORIDE 0.9 % IV SOLN: 500.0000 mL | INTRAVENOUS | Status: DC

## 2013-08-26 NOTE — Op Note (Signed)
[place] [place]  [address] [place], [address]   COLONOSCOPY PROCEDURE REPORT  PATIENT: Erika Robles, Erika Robles  MR#: [id number] BIRTHDATE: 06-Feb-1966 , 47  yrs. old GENDER: Female ENDOSCOPIST: Gatha Mayer, MD, [facility] PROCEDURE DATE:  08/26/2013 PROCEDURE:   Colonoscopy, diagnostic First Screening Colonoscopy - Avg.  risk and is 50 yrs.  old or older - No.  Prior Negative Screening - Now for repeat screening. N/A  History of Adenoma - Now for follow-up colonoscopy & has been > or = to 3 yrs.  N/A  Polyps Removed Today? No.  Recommend repeat exam, <10 yrs? No. ASA CLASS:   Class II INDICATIONS:Change in bowel habits. MEDICATIONS: propofol (Diprivan) 300mg  IV, MAC sedation, administered by CRNA, and These medications were titrated to patient response per physician's verbal order  DESCRIPTION OF PROCEDURE:   After the risks benefits and alternatives of the procedure were thoroughly explained, informed consent was obtained.  A digital rectal exam revealed no abnormalities of the rectum.   The LB PFC-H190 [id number]  endoscope was introduced through the anus and advanced to the cecum, which was identified by both the appendix and ileocecal valve. No adverse events experienced.   The quality of the prep was excellent using Suprep  The instrument was then slowly withdrawn as the colon was fully examined.  COLON FINDINGS: Severe diverticulosis was noted The finding was in the left colon.   Mild diverticulosis was noted The finding was in the right colon.   The colon mucosa was otherwise normal.   A right colon retroflexion was performed.  Retroflexed views revealed no abnormalities. The time to cecum=1 minutes 50 seconds.  Withdrawal time=6 minutes 48 seconds.  The scope was withdrawn and the procedure completed. COMPLICATIONS: There were no complications.  ENDOSCOPIC IMPRESSION: 1.   Severe diverticulosis was noted in the left colon 2.   Mild diverticulosis was noted in  the right colon 3.   The colon mucosa was otherwise normal  RECOMMENDATIONS: Repeat colonoscopy 10 years - 2025   eSigned:  Gatha Mayer, MD, [facility] 08/26/2013 3:23 PM   cc: The Patient and Herbert Pun, MD

## 2013-08-26 NOTE — Patient Instructions (Addendum)
You have diverticulosis - thickened muscle rings and pouches in the colon wall. Please read the handout about this condition.  No polyps or cancer were seen.  Next routine colonoscopy in 10 years - 2025  I appreciate the opportunity to care for you. Gatha Mayer, MD, [facility]   Discharge instructions given with verbal understanding. Handout on diverticulosis. Resume previous medications. YOU HAD AN ENDOSCOPIC PROCEDURE TODAY AT [facility]: Refer to the procedure report that was given to you for any specific questions about what was found during the examination.  If the procedure report does not answer your questions, please call your gastroenterologist to clarify.  If you requested that your care partner not be given the details of your procedure findings, then the procedure report has been included in a sealed envelope for you to review at your convenience later.  YOU SHOULD EXPECT: Some feelings of bloating in the abdomen. Passage of more gas than usual.  Walking can help get rid of the air that was put into your GI tract during the procedure and reduce the bloating. If you had a lower endoscopy (such as a colonoscopy or flexible sigmoidoscopy) you may notice spotting of blood in your stool or on the toilet paper. If you underwent a bowel prep for your procedure, then you may not have a normal bowel movement for a few days.  DIET: Your first meal following the procedure should be a light meal and then it is ok to progress to your normal diet.  A half-sandwich or bowl of soup is an example of a good first meal.  Heavy or fried foods are harder to digest and may make you feel nauseous or bloated.  Likewise meals heavy in dairy and vegetables can cause extra gas to form and this can also increase the bloating.  Drink plenty of fluids but you should avoid alcoholic beverages for 24 hours.  ACTIVITY: Your care partner should take you home directly after the procedure.  You should  plan to take it easy, moving slowly for the rest of the day.  You can resume normal activity the day after the procedure however you should NOT DRIVE or use heavy machinery for 24 hours (because of the sedation medicines used during the test).    SYMPTOMS TO REPORT IMMEDIATELY: A gastroenterologist can be reached at any hour.  During normal business hours, 8:30 AM to 5:00 PM Monday through Friday, call [phone].  After hours and on weekends, please call the GI answering service at [phone] who will take a message and have the physician on call contact you.   Following lower endoscopy (colonoscopy or flexible sigmoidoscopy):  Excessive amounts of blood in the stool  Significant tenderness or worsening of abdominal pains  Swelling of the abdomen that is new, acute  Fever of 100F or higher  FOLLOW UP: If any biopsies were taken you will be contacted by phone or by letter within the next 1-3 weeks.  Call your gastroenterologist if you have not heard about the biopsies in 3 weeks.  Our staff will call the home number listed on your records the next business day following your procedure to check on you and address any questions or concerns that you may have at that time regarding the information given to you following your procedure. This is a courtesy call and so if there is no answer at the home number and we have not heard from you through the emergency physician on call,  we will assume that you have returned to your regular daily activities without incident.  SIGNATURES/CONFIDENTIALITY: You and/or your care partner have signed paperwork which will be entered into your electronic medical record.  These signatures attest to the fact that that the information above on your After Visit Summary has been reviewed and is understood.  Full responsibility of the confidentiality of this discharge information lies with you and/or your care-partner.

## 2013-08-29 ENCOUNTER — Telehealth: Payer: Self-pay | Admitting: *Deleted

## 2013-08-29 NOTE — Telephone Encounter (Signed)
No answer. Message stating mailbox unable to accept messages at this time.

## 2014-02-20 ENCOUNTER — Encounter: Payer: Self-pay | Admitting: Internal Medicine

## 2015-06-04 ENCOUNTER — Encounter: Payer: Self-pay | Admitting: Women's Health

## 2015-06-04 ENCOUNTER — Other Ambulatory Visit (HOSPITAL_COMMUNITY)
Admission: RE | Admit: 2015-06-04 | Discharge: 2015-06-04 | Disposition: A | Discharge status: Home / Self Care | Payer: 59 | Source: Ambulatory Visit | Attending: Women's Health | Admitting: Women's Health

## 2015-06-04 ENCOUNTER — Ambulatory Visit (INDEPENDENT_AMBULATORY_CARE_PROVIDER_SITE_OTHER): Payer: 59 | Admitting: Women's Health

## 2015-06-04 VITALS — BP 124/80 | Ht 65.0 in | Wt 318.0 lb

## 2015-06-04 DIAGNOSIS — Z1151 Encounter for screening for human papillomavirus (HPV): Secondary | ICD-10-CM | POA: Diagnosis not present

## 2015-06-04 DIAGNOSIS — Z01419 Encounter for gynecological examination (general) (routine) without abnormal findings: Secondary | ICD-10-CM | POA: Diagnosis present

## 2015-06-04 NOTE — Patient Instructions (Signed)

## 2015-06-04 NOTE — Progress Notes (Signed)
Erika Robles [birth year] [id number]    History:    Presents for annual exam.  Postmenopausal with no bleeding for greater than one year with elevated FSH. No HRT menopausal symptoms manageable. 2011 LGSIL with a negative C&B with normal Paps after. HSV rare outbreaks. Normal mammogram history. Morbid obesity. Hypothyroid and hypercholesterolemia managed by primary care. 2015 colonoscopy showed diverticulosis.  Past medical history, past surgical history, family history and social history were all reviewed and documented in the EPIC chart. Desk job. Erika Robles 44 graduated college and doing well. 2 sisters with diabetes, sister mother with hypertension.  ROS:  A ROS was performed and pertinent positives and negatives are included.  Exam:  Filed Vitals:   06/04/15 1046  BP: 124/80    General appearance:  Normal Thyroid:  Symmetrical, normal in size, without palpable masses or nodularity. Respiratory  Auscultation:  Clear without wheezing or rhonchi Cardiovascular  Auscultation:  Regular rate, without rubs, murmurs or gallops  Edema/varicosities:  Not grossly evident Abdominal  Soft,nontender, without masses, guarding or rebound.  Liver/spleen:  No organomegaly noted  Hernia:  None appreciated  Skin  Inspection:  Grossly normal   Breasts: Examined lying and sitting.     Right: Without masses, retractions, discharge or axillary adenopathy.     Left: Without masses, retractions, discharge or axillary adenopathy. Gentitourinary   Inguinal/mons:  Normal without inguinal adenopathy  External genitalia:  Normal  BUS/Urethra/Skene's glands:  Normal  Vagina:  Normal  Cervix:  Normal  Uterus:  normal in size, shape and contour.  Midline and mobile  Adnexa/parametria:     Rt: Without masses or tenderness.   Lt: Without masses or tenderness.  Anus and perineum: Normal  Digital rectal exam: Normal sphincter tone without palpated masses or tenderness  Assessment/Plan:  50 y.o. SBF G1  P1 for annual exam with no complaints.  Postmenopausal/no bleeding/no HRT/not sexually active 2011 LGSIL with normal Paps after HSV-rare outbreaks Hypothyroidism/hypercholesterolemia-primary care manages labs and meds Morbid obesity  Plan: SBE's, instructed to have [facility] send copy of mammogram results to our office as well as primary care. Reviewed importance of increasing regular exercise and decreasing calories for weight loss. Weight loss surgery also reviewed. Weight is up 30 pounds from last year. Condoms encouraged if becomes sexually active. Pap with HR HPV typing, new screening guidelines reviewed. Denies need for Valtrex.    Huel Cote [facility], 11:39 AM 06/04/2015

## 2015-06-05 LAB — URINALYSIS W MICROSCOPIC + REFLEX CULTURE
Bilirubin Urine: NEGATIVE
CASTS: NONE SEEN [LPF]
GLUCOSE, UA: NEGATIVE
Hgb urine dipstick: NEGATIVE
Ketones, ur: NEGATIVE
Leukocytes, UA: NEGATIVE
NITRITE: NEGATIVE
PH: 5.5 (ref 5.0–8.0)
Protein, ur: NEGATIVE
RBC / HPF: NONE SEEN RBC/HPF (ref <=2)
SPECIFIC GRAVITY, URINE: 1.026 (ref 1.001–1.035)
Yeast: NONE SEEN [HPF]

## 2015-06-05 LAB — CYTOLOGY - PAP

## 2015-06-06 LAB — URINE CULTURE: Colony Count: 7000

## 2015-08-20 DIAGNOSIS — L500 Allergic urticaria: Secondary | ICD-10-CM | POA: Insufficient documentation

## 2015-08-20 DIAGNOSIS — J302 Other seasonal allergic rhinitis: Secondary | ICD-10-CM | POA: Insufficient documentation

## 2015-11-22 DIAGNOSIS — J45909 Unspecified asthma, uncomplicated: Secondary | ICD-10-CM | POA: Insufficient documentation

## 2016-07-10 ENCOUNTER — Encounter: Payer: Self-pay | Admitting: Women's Health

## 2016-07-10 ENCOUNTER — Ambulatory Visit (INDEPENDENT_AMBULATORY_CARE_PROVIDER_SITE_OTHER): Payer: Managed Care, Other (non HMO) | Admitting: Women's Health

## 2016-07-10 VITALS — BP 122/84 | Ht 65.0 in | Wt 322.6 lb

## 2016-07-10 DIAGNOSIS — Z01419 Encounter for gynecological examination (general) (routine) without abnormal findings: Secondary | ICD-10-CM | POA: Diagnosis not present

## 2016-07-10 DIAGNOSIS — B009 Herpesviral infection, unspecified: Secondary | ICD-10-CM | POA: Diagnosis not present

## 2016-07-10 MED ORDER — VALACYCLOVIR HCL 500 MG PO TABS: ORAL_TABLET | ORAL | 12 refills | Status: DC

## 2016-07-10 NOTE — Progress Notes (Signed)
Erika Robles [birth year] [id number]    History:    Presents for annual exam. Postmenopausal/ no bleeding/ no HRT/not sexually active. Normal pap and mammogram history. Morbid obesity, in program for weight loss surgery/gastric sleeve . Hypothyroid and hypercholesterolemia managed by primary care. 2015 colonoscopy showed diverticulosis. Mammogram done this year with [facility], reports as normal.   Past medical history, past surgical history, family history and social history were all reviewed and documented in the EPIC chart. 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 Two sisters with diabetes, sister mother with hypertension.  ROS:  A ROS was performed and pertinent positives and negatives are included.  Exam:  Vitals:   07/10/16 0827  BP: 122/84  Weight: (!) 322 lb 9.6 oz (146.3 kg)  Height: 5\' 5"  (1.651 m)   Body mass index is 53.68 kg/m.   General appearance:  Normal Thyroid:  Symmetrical, normal in size, without palpable masses or nodularity. Respiratory  Auscultation:  Clear without wheezing or rhonchi Cardiovascular  Auscultation:  Regular rate, without rubs, murmurs or gallops  Edema/varicosities:  Not grossly evident Abdominal  Soft,nontender, without masses, guarding or rebound.  Liver/spleen:  No organomegaly noted  Hernia:  None appreciated  Skin  Inspection:  Grossly normal   Breasts: Examined lying and sitting. Pendulous    Right: Without masses, retractions, discharge or axillary adenopathy.     Left: Without masses, retractions, discharge or axillary adenopathy. Gentitourinary   Inguinal/mons:  Normal without inguinal adenopathy  External genitalia:  Normal  BUS/Urethra/Skene's glands:  Normal  Vagina:  Normal  Cervix:  Normal  Uterus:   normal in size, shape and contour.  Midline and mobile  Adnexa/parametria:     Rt: Without masses or tenderness.   Lt: Without masses or tenderness.  Anus and perineum: Normal  Digital rectal exam: Normal sphincter tone  without palpated masses or tenderness  Assessment/Plan:  51 y.o.  for annual exam. SBF, G1P1 with no complaints.  Postmenopausal, no bleeding,on no HRT Hypothyroidism/Hypercholesterolemia-primary care manages labs and medication Obesity HSV rare outbreaks  Plan: Discussed and reviewed the advantages and risks of weight loss surgery, will keep scheduled followup. Healthy lifestyle, diet and exercise encouraged SBE's, continue annual screening mammogram done at [facility], instructed to send a copy. instructed to use condoms if become sexually active. Vitamin D 2000 daily encouraged. Valtrex 500 twice daily for 3-5 days when necessary. Pap normal with negative HR HPV 2017, new screening guidelines reviewed.    Huel Cote [facility], 8:49 AM 07/10/2016

## 2016-07-10 NOTE — Patient Instructions (Signed)
Sleeve Gastrectomy, Care After Refer to this sheet in the next few weeks. These instructions provide you with information about caring for yourself after your procedure. Your health care provider may also give you more specific instructions. Your treatment has been planned according to current medical practices, but problems sometimes occur. Call your health care provider if you have any problems or questions after your procedure. What can I expect after the procedure? After the procedure, it is common to have:  Pain in your abdomen.  Decreased appetite.  Clear fluid leaking through the small tube (drain) that comes from your incision site. Follow these instructions at home: Medicines   Take over-the-counter and prescription medicines only as told by your health care provider.  Do not drive for 24 hours if you received a sedative.  Do not drive or operate heavy machinery while taking prescription pain medicine. Incision and drain care    Follow instructions from your health care provider about how to take care of your incisions. Make sure you:  Wash your hands with soap and water before you change your bandage (dressing). If soap and water are not available, use hand sanitizer.  Change your dressing as told by your health care provider.  Leave stitches (sutures), skin glue, or adhesive strips in place. These skin closures may need to be in place for 2 weeks or longer. If adhesive strip edges start to loosen and curl up, you may trim the loose edges. Do not remove adhesive strips completely unless your health care provider tells you to do that.  Keep the area around your incisions and your drain clean and dry.  Check your incision areas every day for signs of infection. Check for:  More redness, swelling, or pain.  More fluid or blood.  Warmth.  Pus or a bad smell.  Empty your drain every day. Follow instructions from your health care provider about recording the amount of  fluid that comes from your drain. Make note of any changes in the amount or appearance of the fluid. Activity   Return to your normal activities as told by your health care provider. Ask your health care provider what activities are safe for you.  Do not lift anything that is heavier than 10 lb (4.5 kg).  Avoid intense physical activity for as long as told by your health care provider.  Move around at least once per day, every day. As you start to feel better, you may start to exercise more. Eating and drinking   Follow instructions from your health care provider about eating or drinking restrictions. You will be given instructions about the type, the size, and the timing of your meals.  Keep track of any foods that cause discomfort, such as bloating or cramping.  Eat healthy foods. Avoid foods that are high in fat or sugar.  Stop eating when you feel full.  Take supplements only as told by your health care provider.  Drink enough fluid to keep your urine clear or pale yellow. General instructions   Do not take baths, swim, or use a hot tub until your health care provider approves. Ask your health care provider if you can take showers. You may only be allowed to take sponge baths for bathing.  Do not use tobacco products, including cigarettes, chewing tobacco, or e-cigarettes. If you need help quitting, ask your health care provider.  Wear compression stockings as told by your health care provider. These stockings help to prevent blood clots and reduce swelling  in your legs.  Do breathing exercises as told by your health care provider.  Keep all follow-up visits as told by your health care provider. This is important. Contact a health care provider if:  You have pain that gets worse or does not get better with medicine.  You have more redness, swelling, or pain around your incisions.  You have more fluid or blood coming from your incisions.  Your incisions feel warm to the  touch.  You have pus or a bad smell coming from your incisions.  You have a fever or chills.  You have problems with your drain.  You have green or bad-smelling fluid leaking from your drain. Get help right away if:  You have difficulty breathing.  You have severe pain, especially in your legs. This information is not intended to replace advice given to you by your health care provider. Make sure you discuss any questions you have with your health care provider. Document Released: 02/01/2009 Document Revised: 12/02/2015 Document Reviewed: 09/29/2014 Elsevier Interactive Patient Education  2017 [place] Maintenance for Postmenopausal Women Menopause is a normal process in which your reproductive ability comes to an end. This process happens gradually over a span of months to years, usually between the ages of 62 and 39. Menopause is complete when you have missed 12 consecutive menstrual periods. It is important to talk with your health care provider about some of the most common conditions that affect postmenopausal women, such as heart disease, cancer, and bone loss (osteoporosis). Adopting a healthy lifestyle and getting preventive care can help to promote your health and wellness. Those actions can also lower your chances of developing some of these common conditions. What should I know about menopause? During menopause, you may experience a number of symptoms, such as:  Moderate-to-severe hot flashes.  Night sweats.  Decrease in sex drive.  Mood swings.  Headaches.  Tiredness.  Irritability.  Memory problems.  Insomnia. Choosing to treat or not to treat menopausal changes is an individual decision that you make with your health care provider. What should I know about hormone replacement therapy and supplements? Hormone therapy products are effective for treating symptoms that are associated with menopause, such as hot flashes and night sweats. Hormone  replacement carries certain risks, especially as you become older. If you are thinking about using estrogen or estrogen with progestin treatments, discuss the benefits and risks with your health care provider. What should I know about heart disease and stroke? Heart disease, heart attack, and stroke become more likely as you age. This may be due, in part, to the hormonal changes that your body experiences during menopause. These can affect how your body processes dietary fats, triglycerides, and cholesterol. Heart attack and stroke are both medical emergencies. There are many things that you can do to help prevent heart disease and stroke:  Have your blood pressure checked at least every 1-2 years. High blood pressure causes heart disease and increases the risk of stroke.  If you are 79-7 years old, ask your health care provider if you should take aspirin to prevent a heart attack or a stroke.  Do not use any tobacco products, including cigarettes, chewing tobacco, or electronic cigarettes. If you need help quitting, ask your health care provider.  It is important to eat a healthy diet and maintain a healthy weight.  Be sure to include plenty of vegetables, fruits, low-fat dairy products, and lean protein.  Avoid eating foods that are high in solid  fats, added sugars, or salt (sodium).  Get regular exercise. This is one of the most important things that you can do for your health.  Try to exercise for at least 150 minutes each week. The type of exercise that you do should increase your heart rate and make you sweat. This is known as moderate-intensity exercise.  Try to do strengthening exercises at least twice each week. Do these in addition to the moderate-intensity exercise.  Know your numbers.Ask your health care provider to check your cholesterol and your blood glucose. Continue to have your blood tested as directed by your health care provider. What should I know about cancer  screening? There are several types of cancer. Take the following steps to reduce your risk and to catch any cancer development as early as possible. Breast Cancer  Practice breast self-awareness.  This means understanding how your breasts normally appear and feel.  It also means doing regular breast self-exams. Let your health care provider know about any changes, no matter how small.  If you are 3 or older, have a clinician do a breast exam (clinical breast exam or CBE) every year. Depending on your age, family history, and medical history, it may be recommended that you also have a yearly breast X-ray (mammogram).  If you have a family history of breast cancer, talk with your health care provider about genetic screening.  If you are at high risk for breast cancer, talk with your health care provider about having an MRI and a mammogram every year.  Breast cancer (BRCA) gene test is recommended for women who have family members with BRCA-related cancers. Results of the assessment will determine the need for genetic counseling and BRCA1 and for BRCA2 testing. BRCA-related cancers include these types:  Breast. This occurs in males or females.  Ovarian.  Tubal. This may also be called fallopian tube cancer.  Cancer of the abdominal or pelvic lining (peritoneal cancer).  Prostate.  Pancreatic. Cervical, Uterine, and Ovarian Cancer  Your health care provider may recommend that you be screened regularly for cancer of the pelvic organs. These include your ovaries, uterus, and vagina. This screening involves a pelvic exam, which includes checking for microscopic changes to the surface of your cervix (Pap test).  For women ages 21-65, health care providers may recommend a pelvic exam and a Pap test every three years. For women ages 68-65, they may recommend the Pap test and pelvic exam, combined with testing for human papilloma virus (HPV), every five years. Some types of HPV increase your  risk of cervical cancer. Testing for HPV may also be done on women of any age who have unclear Pap test results.  Other health care providers may not recommend any screening for nonpregnant women who are considered low risk for pelvic cancer and have no symptoms. Ask your health care provider if a screening pelvic exam is right for you.  If you have had past treatment for cervical cancer or a condition that could lead to cancer, you need Pap tests and screening for cancer for at least 20 years after your treatment. If Pap tests have been discontinued for you, your risk factors (such as having a new sexual partner) need to be reassessed to determine if you should start having screenings again. Some women have medical problems that increase the chance of getting cervical cancer. In these cases, your health care provider may recommend that you have screening and Pap tests more often.  If you have a family  history of uterine cancer or ovarian cancer, talk with your health care provider about genetic screening.  If you have vaginal bleeding after reaching menopause, tell your health care provider.  There are currently no reliable tests available to screen for ovarian cancer. Lung Cancer  Lung cancer screening is recommended for adults 59-45 years old who are at high risk for lung cancer because of a history of smoking. A yearly low-dose CT scan of the lungs is recommended if you:  Currently smoke.  Have a history of at least 30 pack-years of smoking and you currently smoke or have quit within the past 15 years. A pack-year is smoking an average of one pack of cigarettes per day for one year. Yearly screening should:  Continue until it has been 15 years since you quit.  Stop if you develop a health problem that would prevent you from having lung cancer treatment. Colorectal Cancer  This type of cancer can be detected and can often be prevented.  Routine colorectal cancer screening usually begins  at age 60 and continues through age 4.  If you have risk factors for colon cancer, your health care provider may recommend that you be screened at an earlier age.  If you have a family history of colorectal cancer, talk with your health care provider about genetic screening.  Your health care provider may also recommend using home test kits to check for hidden blood in your stool.  A small camera at the end of a tube can be used to examine your colon directly (sigmoidoscopy or colonoscopy). This is done to check for the earliest forms of colorectal cancer.  Direct examination of the colon should be repeated every 5-10 years until age 40. However, if early forms of precancerous polyps or small growths are found or if you have a family history or genetic risk for colorectal cancer, you may need to be screened more often. Skin Cancer  Check your skin from head to toe regularly.  Monitor any moles. Be sure to tell your health care provider:  About any new moles or changes in moles, especially if there is a change in a mole's shape or color.  If you have a mole that is larger than the size of a pencil eraser.  If any of your family members has a history of skin cancer, especially at a [place] age, talk with your health care provider about genetic screening.  Always use sunscreen. Apply sunscreen liberally and repeatedly throughout the day.  Whenever you are outside, protect yourself by wearing long sleeves, pants, a wide-brimmed hat, and sunglasses. What should I know about osteoporosis? Osteoporosis is a condition in which bone destruction happens more quickly than new bone creation. After menopause, you may be at an increased risk for osteoporosis. To help prevent osteoporosis or the bone fractures that can happen because of osteoporosis, the following is recommended:  If you are 45-4 years old, get at least 1,000 mg of calcium and at least 600 mg of vitamin D per day.  If you are older  than age 64 but younger than age 43, get at least 1,200 mg of calcium and at least 600 mg of vitamin D per day.  If you are older than age 6, get at least 1,200 mg of calcium and at least 800 mg of vitamin D per day. Smoking and excessive alcohol intake increase the risk of osteoporosis. Eat foods that are rich in calcium and vitamin D, and do weight-bearing exercises several  times each week as directed by your health care provider. What should I know about how menopause affects my mental health? Depression may occur at any age, but it is more common as you become older. Common symptoms of depression include:  Low or sad mood.  Changes in sleep patterns.  Changes in appetite or eating patterns.  Feeling an overall lack of motivation or enjoyment of activities that you previously enjoyed.  Frequent crying spells. Talk with your health care provider if you think that you are experiencing depression. What should I know about immunizations? It is important that you get and maintain your immunizations. These include:  Tetanus, diphtheria, and pertussis (Tdap) booster vaccine.  Influenza every year before the flu season begins.  Pneumonia vaccine.  Shingles vaccine. Your health care provider may also recommend other immunizations. This information is not intended to replace advice given to you by your health care provider. Make sure you discuss any questions you have with your health care provider. Document Released: 05/30/2005 Document Revised: 10/26/2015 Document Reviewed: 01/09/2015 Elsevier Interactive Patient Education  2017 [employer].

## 2016-07-17 ENCOUNTER — Other Ambulatory Visit: Payer: Self-pay | Admitting: Surgical Oncology

## 2016-07-17 DIAGNOSIS — E039 Hypothyroidism, unspecified: Secondary | ICD-10-CM

## 2016-07-17 DIAGNOSIS — R7301 Impaired fasting glucose: Secondary | ICD-10-CM

## 2016-07-17 DIAGNOSIS — J4520 Mild intermittent asthma, uncomplicated: Secondary | ICD-10-CM

## 2016-07-25 ENCOUNTER — Ambulatory Visit
Admission: RE | Admit: 2016-07-25 | Discharge: 2016-07-25 | Disposition: A | Discharge status: Home / Self Care | Payer: Managed Care, Other (non HMO) | Source: Ambulatory Visit | Attending: Surgical Oncology | Admitting: Surgical Oncology

## 2016-07-25 DIAGNOSIS — J4520 Mild intermittent asthma, uncomplicated: Secondary | ICD-10-CM

## 2016-07-25 DIAGNOSIS — R7301 Impaired fasting glucose: Secondary | ICD-10-CM

## 2016-07-25 DIAGNOSIS — E039 Hypothyroidism, unspecified: Secondary | ICD-10-CM

## 2017-02-03 DIAGNOSIS — Z9884 Bariatric surgery status: Secondary | ICD-10-CM | POA: Insufficient documentation

## 2017-07-15 ENCOUNTER — Encounter: Payer: Self-pay | Admitting: Women's Health

## 2017-07-15 ENCOUNTER — Ambulatory Visit (INDEPENDENT_AMBULATORY_CARE_PROVIDER_SITE_OTHER): Payer: Managed Care, Other (non HMO) | Admitting: Women's Health

## 2017-07-15 VITALS — BP 126/78 | Ht 65.0 in | Wt 251.0 lb

## 2017-07-15 DIAGNOSIS — Z01419 Encounter for gynecological examination (general) (routine) without abnormal findings: Secondary | ICD-10-CM

## 2017-07-15 DIAGNOSIS — B009 Herpesviral infection, unspecified: Secondary | ICD-10-CM

## 2017-07-15 DIAGNOSIS — Z9884 Bariatric surgery status: Secondary | ICD-10-CM | POA: Diagnosis not present

## 2017-07-15 MED ORDER — VALACYCLOVIR HCL 500 MG PO TABS: ORAL_TABLET | ORAL | 12 refills | Status: AC

## 2017-07-15 NOTE — Progress Notes (Signed)
Erika Robles 1965/08/07 [id number]    History:    Presents for annual exam.  Is menopausal on no HRT with no bleeding.  Normal Pap and mammogram history has had normal mammograms at [facility] records not available.  2015 colonoscopy showed mild diverticuli no polyps.  01/2017 gastric sleeve surgery is down 80 pounds with continued weight loss.  Primary care manages hypothyroidism and hypercholesteremia which has improved.  Not sexually active.  Past medical history, past surgical history, family history and social history were all reviewed and documented in the EPIC chart.  Works at [employer] job.  2 sisters with diabetes, sister and mother with hypertension.  Son is 16 recently moved to [facility] ([address]).  ROS:  A ROS was performed and pertinent positives and negatives are included.  Exam:  Vitals:   07/15/17 0913  BP: 126/78  Weight: 251 lb (113.9 kg)  Height: 5\' 5"  (1.651 m)   Body mass index is 41.77 kg/m.   General appearance:  Normal Thyroid:  Symmetrical, normal in size, without palpable masses or nodularity. Respiratory  Auscultation:  Clear without wheezing or rhonchi Cardiovascular  Auscultation:  Regular rate, without rubs, murmurs or gallops  Edema/varicosities:  Not grossly evident Abdominal  Soft,nontender, without masses, guarding or rebound.  Liver/spleen:  No organomegaly noted  Hernia:  None appreciated  Skin  Inspection:  Grossly normal   Breasts: Examined lying and sitting.     Right: Without masses, retractions, discharge or axillary adenopathy.     Left: Without masses, retractions, discharge or axillary adenopathy. Gentitourinary   Inguinal/mons:  Normal without inguinal adenopathy  External genitalia:  Normal  BUS/Urethra/Skene's glands:  Normal  Vagina:  Normal  Cervix:  Normal  Uterus:   normal in size, shape and contour.  Midline and mobile  Adnexa/parametria:     Rt: Without masses or tenderness.   Lt: Without masses or  tenderness.  Anus and perineum: Normal  Digital rectal exam: Normal sphincter tone without palpated masses or tenderness  Assessment/Plan:  52 y.o. SBF G1 P1 for annual exam with no complaints.  Postmenopausal/no HRT/no bleeding Hypothyroidism, hypercholesteremia-primary care manages labs and meds HSV no outbreaks 01/2017 gastric sleeve weight down 80 pounds.  Plan: Valtrex 500 twice daily for 3-5 days if needed.  SBE's, continue annual screening mammogram instructed to have results sent to our office.  Congratulated on weight loss, continue healthy lifestyle of small meals and increasing regular exercise, yoga encouraged.  Home safety, fall prevention discussed.  Vitamin  D 2000 daily encouraged.  Pap normal 2017, new screening guidelines reviewed.    [place], 9:46 AM 07/15/2017

## 2017-07-15 NOTE — Patient Instructions (Signed)
Health Maintenance for Postmenopausal Women Menopause is a normal process in which your reproductive ability comes to an end. This process happens gradually over a span of months to years, usually between the ages of 22 and 9. Menopause is complete when you have missed 12 consecutive menstrual periods. It is important to talk with your health care provider about some of the most common conditions that affect postmenopausal women, such as heart disease, cancer, and bone loss (osteoporosis). Adopting a healthy lifestyle and getting preventive care can help to promote your health and wellness. Those actions can also lower your chances of developing some of these common conditions. What should I know about menopause? During menopause, you may experience a number of symptoms, such as:  Moderate-to-severe hot flashes.  Night sweats.  Decrease in sex drive.  Mood swings.  Headaches.  Tiredness.  Irritability.  Memory problems.  Insomnia.  Choosing to treat or not to treat menopausal changes is an individual decision that you make with your health care provider. What should I know about hormone replacement therapy and supplements? Hormone therapy products are effective for treating symptoms that are associated with menopause, such as hot flashes and night sweats. Hormone replacement carries certain risks, especially as you become older. If you are thinking about using estrogen or estrogen with progestin treatments, discuss the benefits and risks with your health care provider. What should I know about heart disease and stroke? Heart disease, heart attack, and stroke become more likely as you age. This may be due, in part, to the hormonal changes that your body experiences during menopause. These can affect how your body processes dietary fats, triglycerides, and cholesterol. Heart attack and stroke are both medical emergencies. There are many things that you can do to help prevent heart disease  and stroke:  Have your blood pressure checked at least every 1-2 years. High blood pressure causes heart disease and increases the risk of stroke.  If you are 53-22 years old, ask your health care provider if you should take aspirin to prevent a heart attack or a stroke.  Do not use any tobacco products, including cigarettes, chewing tobacco, or electronic cigarettes. If you need help quitting, ask your health care provider.  It is important to eat a healthy diet and maintain a healthy weight. ? Be sure to include plenty of vegetables, fruits, low-fat dairy products, and lean protein. ? Avoid eating foods that are high in solid fats, added sugars, or salt (sodium).  Get regular exercise. This is one of the most important things that you can do for your health. ? Try to exercise for at least 150 minutes each week. The type of exercise that you do should increase your heart rate and make you sweat. This is known as moderate-intensity exercise. ? Try to do strengthening exercises at least twice each week. Do these in addition to the moderate-intensity exercise.  Know your numbers.Ask your health care provider to check your cholesterol and your blood glucose. Continue to have your blood tested as directed by your health care provider.  What should I know about cancer screening? There are several types of cancer. Take the following steps to reduce your risk and to catch any cancer development as early as possible. Breast Cancer  Practice breast self-awareness. ? This means understanding how your breasts normally appear and feel. ? It also means doing regular breast self-exams. Let your health care provider know about any changes, no matter how small.  If you are 40  or older, have a clinician do a breast exam (clinical breast exam or CBE) every year. Depending on your age, family history, and medical history, it may be recommended that you also have a yearly breast X-ray (mammogram).  If you  have a family history of breast cancer, talk with your health care provider about genetic screening.  If you are at high risk for breast cancer, talk with your health care provider about having an MRI and a mammogram every year.  Breast cancer (BRCA) gene test is recommended for women who have family members with BRCA-related cancers. Results of the assessment will determine the need for genetic counseling and BRCA1 and for BRCA2 testing. BRCA-related cancers include these types: ? Breast. This occurs in males or females. ? Ovarian. ? Tubal. This may also be called fallopian tube cancer. ? Cancer of the abdominal or pelvic lining (peritoneal cancer). ? Prostate. ? Pancreatic.  Cervical, Uterine, and Ovarian Cancer Your health care provider may recommend that you be screened regularly for cancer of the pelvic organs. These include your ovaries, uterus, and vagina. This screening involves a pelvic exam, which includes checking for microscopic changes to the surface of your cervix (Pap test).  For women ages 21-65, health care providers may recommend a pelvic exam and a Pap test every three years. For women ages 79-65, they may recommend the Pap test and pelvic exam, combined with testing for human papilloma virus (HPV), every five years. Some types of HPV increase your risk of cervical cancer. Testing for HPV may also be done on women of any age who have unclear Pap test results.  Other health care providers may not recommend any screening for nonpregnant women who are considered low risk for pelvic cancer and have no symptoms. Ask your health care provider if a screening pelvic exam is right for you.  If you have had past treatment for cervical cancer or a condition that could lead to cancer, you need Pap tests and screening for cancer for at least 20 years after your treatment. If Pap tests have been discontinued for you, your risk factors (such as having a new sexual partner) need to be  reassessed to determine if you should start having screenings again. Some women have medical problems that increase the chance of getting cervical cancer. In these cases, your health care provider may recommend that you have screening and Pap tests more often.  If you have a family history of uterine cancer or ovarian cancer, talk with your health care provider about genetic screening.  If you have vaginal bleeding after reaching menopause, tell your health care provider.  There are currently no reliable tests available to screen for ovarian cancer.  Lung Cancer Lung cancer screening is recommended for adults 69-62 years old who are at high risk for lung cancer because of a history of smoking. A yearly low-dose CT scan of the lungs is recommended if you:  Currently smoke.  Have a history of at least 30 pack-years of smoking and you currently smoke or have quit within the past 15 years. A pack-year is smoking an average of one pack of cigarettes per day for one year.  Yearly screening should:  Continue until it has been 15 years since you quit.  Stop if you develop a health problem that would prevent you from having lung cancer treatment.  Colorectal Cancer  This type of cancer can be detected and can often be prevented.  Routine colorectal cancer screening usually begins at  age 42 and continues through age 45.  If you have risk factors for colon cancer, your health care provider may recommend that you be screened at an earlier age.  If you have a family history of colorectal cancer, talk with your health care provider about genetic screening.  Your health care provider may also recommend using home test kits to check for hidden blood in your stool.  A small camera at the end of a tube can be used to examine your colon directly (sigmoidoscopy or colonoscopy). This is done to check for the earliest forms of colorectal cancer.  Direct examination of the colon should be repeated every  5-10 years until age 71. However, if early forms of precancerous polyps or small growths are found or if you have a family history or genetic risk for colorectal cancer, you may need to be screened more often.  Skin Cancer  Check your skin from head to toe regularly.  Monitor any moles. Be sure to tell your health care provider: ? About any new moles or changes in moles, especially if there is a change in a mole's shape or color. ? If you have a mole that is larger than the size of a pencil eraser.  If any of your family members has a history of skin cancer, especially at a Jayro Mcmath age, talk with your health care provider about genetic screening.  Always use sunscreen. Apply sunscreen liberally and repeatedly throughout the day.  Whenever you are outside, protect yourself by wearing long sleeves, pants, a wide-brimmed hat, and sunglasses.  What should I know about osteoporosis? Osteoporosis is a condition in which bone destruction happens more quickly than new bone creation. After menopause, you may be at an increased risk for osteoporosis. To help prevent osteoporosis or the bone fractures that can happen because of osteoporosis, the following is recommended:  If you are 46-71 years old, get at least 1,000 mg of calcium and at least 600 mg of vitamin D per day.  If you are older than age 55 but younger than age 65, get at least 1,200 mg of calcium and at least 600 mg of vitamin D per day.  If you are older than age 54, get at least 1,200 mg of calcium and at least 800 mg of vitamin D per day.  Smoking and excessive alcohol intake increase the risk of osteoporosis. Eat foods that are rich in calcium and vitamin D, and do weight-bearing exercises several times each week as directed by your health care provider. What should I know about how menopause affects my mental health? Depression may occur at any age, but it is more common as you become older. Common symptoms of depression  include:  Low or sad mood.  Changes in sleep patterns.  Changes in appetite or eating patterns.  Feeling an overall lack of motivation or enjoyment of activities that you previously enjoyed.  Frequent crying spells.  Talk with your health care provider if you think that you are experiencing depression. What should I know about immunizations? It is important that you get and maintain your immunizations. These include:  Tetanus, diphtheria, and pertussis (Tdap) booster vaccine.  Influenza every year before the flu season begins.  Pneumonia vaccine.  Shingles vaccine.  Your health care provider may also recommend other immunizations. This information is not intended to replace advice given to you by your health care provider. Make sure you discuss any questions you have with your health care provider. Document Released: 05/30/2005  Document Revised: 10/26/2015 Document Reviewed: 01/09/2015 Elsevier Interactive Patient Education  2018 [employer]  

## 2017-07-17 LAB — URINALYSIS, COMPLETE W/RFL CULTURE
BILIRUBIN URINE: NEGATIVE
Bacteria, UA: NONE SEEN /HPF
Glucose, UA: NEGATIVE
Hgb urine dipstick: NEGATIVE
Hyaline Cast: NONE SEEN /LPF
Ketones, ur: NEGATIVE
LEUKOCYTE ESTERASE: NEGATIVE
NITRITES URINE, INITIAL: NEGATIVE
PH: 5.5 (ref 5.0–8.0)
Protein, ur: NEGATIVE
RBC / HPF: NONE SEEN /HPF (ref 0–2)
SPECIFIC GRAVITY, URINE: 1.012 (ref 1.001–1.03)
WBC, UA: NONE SEEN /HPF (ref 0–5)

## 2017-07-17 LAB — URINE CULTURE
MICRO NUMBER:: 90382205
SPECIMEN QUALITY:: ADEQUATE

## 2017-07-17 LAB — NO CULTURE INDICATED

## 2018-02-20 ENCOUNTER — Encounter: Payer: Self-pay | Admitting: Women's Health

## 2018-04-21 HISTORY — PX: BREAST ENHANCEMENT SURGERY: SHX7

## 2018-07-16 ENCOUNTER — Other Ambulatory Visit: Payer: Self-pay

## 2018-07-20 ENCOUNTER — Encounter: Payer: Managed Care, Other (non HMO) | Admitting: Women's Health

## 2018-08-25 ENCOUNTER — Ambulatory Visit (INDEPENDENT_AMBULATORY_CARE_PROVIDER_SITE_OTHER): Payer: Managed Care, Other (non HMO) | Admitting: Women's Health

## 2018-08-25 ENCOUNTER — Encounter: Payer: Self-pay | Admitting: Women's Health

## 2018-08-25 ENCOUNTER — Other Ambulatory Visit: Payer: Self-pay

## 2018-08-25 VITALS — BP 118/78 | Ht 65.0 in | Wt 201.0 lb

## 2018-08-25 DIAGNOSIS — Z01419 Encounter for gynecological examination (general) (routine) without abnormal findings: Secondary | ICD-10-CM

## 2018-08-25 NOTE — Addendum Note (Signed)
Addended by: Lorine Bears on: 08/25/2018 09:40 AM   Modules accepted: Orders

## 2018-08-25 NOTE — Progress Notes (Signed)
Erika Robles 02-25-1966 [id number]    History:    Presents for annual exam.  Postmenopausal on no HRT with no bleeding.  Normal Pap and mammogram history.  2015 negative colonoscopy.  Hypothyroid.  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  Not sexually active.  HSV no outbreaks.  Past medical history, past surgical history, family history and social history were all reviewed and documented in the EPIC chart.  Works for [employer], [place] location closing has a job until end of September with a severance.  Son lives in [place] doing well.  2 sisters diabetes, mother and sister hypertension.  ROS:  A ROS was performed and pertinent positives and negatives are included.  Exam:  Vitals:   08/25/18 0850  BP: 118/78  Weight: 201 lb (91.2 kg)  Height: 5\' 5"  (1.651 m)   Body mass index is 33.45 kg/m.   General appearance:  Normal Thyroid:  Symmetrical, normal in size, without palpable masses or nodularity. Respiratory  Auscultation:  Clear without wheezing or rhonchi Cardiovascular  Auscultation:  Regular rate, without rubs, murmurs or gallops  Edema/varicosities:  Not grossly evident Abdominal  Soft,nontender, without masses, guarding or rebound.  Liver/spleen:  No organomegaly noted  Hernia:  None appreciated  Skin  Inspection:  Grossly normal   Breasts: Examined lying and sitting.     Right: Without masses, retractions, discharge or axillary adenopathy.     Left: Without masses, retractions, discharge or axillary adenopathy. Gentitourinary   Inguinal/mons:  Normal without inguinal adenopathy  External genitalia:  Normal  BUS/Urethra/Skene's glands:  Normal  Vagina: Atrophic  Cervix:  Normal  Uterus:   normal in size, shape and contour.  Midline and mobile  Adnexa/parametria:     Rt: Without masses or tenderness.   Lt: Without masses or tenderness.  Anus and perineum: Normal  Digital rectal exam: Normal sphincter tone without palpated masses or  tenderness  Assessment/Plan:  53 y.o. SBF G1, P1 for annual exam with no complaints.  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  Continue increasing regular cardio type exercise, balance type exercise encouraged.  Vitamin D 2000 daily encouraged.  Congratulations given on weight loss and healthy lifestyle.  Condoms, vaginal lubricant encouraged if sexually active.  Pap with HR HPV typing, new screening guidelines reviewed.    H[place], 8:53 AM 08/25/2018

## 2018-08-25 NOTE — Patient Instructions (Signed)
Health Maintenance for Postmenopausal Women Menopause is a normal process in which your reproductive ability comes to an end. This process happens gradually over a span of months to years, usually between the ages of 62 and 89. Menopause is complete when you have missed 12 consecutive menstrual periods. It is important to talk with your health care provider about some of the most common conditions that affect postmenopausal women, such as heart disease, cancer, and bone loss (osteoporosis). Adopting a healthy lifestyle and getting preventive care can help to promote your health and wellness. Those actions can also lower your chances of developing some of these common conditions. What should I know about menopause? During menopause, you may experience a number of symptoms, such as:  Moderate-to-severe hot flashes.  Night sweats.  Decrease in sex drive.  Mood swings.  Headaches.  Tiredness.  Irritability.  Memory problems.  Insomnia. Choosing to treat or not to treat menopausal changes is an individual decision that you make with your health care provider. What should I know about hormone replacement therapy and supplements? Hormone therapy products are effective for treating symptoms that are associated with menopause, such as hot flashes and night sweats. Hormone replacement carries certain risks, especially as you become older. If you are thinking about using estrogen or estrogen with progestin treatments, discuss the benefits and risks with your health care provider. What should I know about heart disease and stroke? Heart disease, heart attack, and stroke become more likely as you age. This may be due, in part, to the hormonal changes that your body experiences during menopause. These can affect how your body processes dietary fats, triglycerides, and cholesterol. Heart attack and stroke are both medical emergencies. There are many things that you can do to help prevent heart disease  and stroke:  Have your blood pressure checked at least every 1-2 years. High blood pressure causes heart disease and increases the risk of stroke.  If you are 79-72 years old, ask your health care provider if you should take aspirin to prevent a heart attack or a stroke.  Do not use any tobacco products, including cigarettes, chewing tobacco, or electronic cigarettes. If you need help quitting, ask your health care provider.  It is important to eat a healthy diet and maintain a healthy weight. ? Be sure to include plenty of vegetables, fruits, low-fat dairy products, and lean protein. ? Avoid eating foods that are high in solid fats, added sugars, or salt (sodium).  Get regular exercise. This is one of the most important things that you can do for your health. ? Try to exercise for at least 150 minutes each week. The type of exercise that you do should increase your heart rate and make you sweat. This is known as moderate-intensity exercise. ? Try to do strengthening exercises at least twice each week. Do these in addition to the moderate-intensity exercise.  Know your numbers.Ask your health care provider to check your cholesterol and your blood glucose. Continue to have your blood tested as directed by your health care provider.  What should I know about cancer screening? There are several types of cancer. Take the following steps to reduce your risk and to catch any cancer development as early as possible. Breast Cancer  Practice breast self-awareness. ? This means understanding how your breasts normally appear and feel. ? It also means doing regular breast self-exams. Let your health care provider know about any changes, no matter how small.  If you are 40 or  older, have a clinician do a breast exam (clinical breast exam or CBE) every year. Depending on your age, family history, and medical history, it may be recommended that you also have a yearly breast X-ray (mammogram).  If you  have a family history of breast cancer, talk with your health care provider about genetic screening.  If you are at high risk for breast cancer, talk with your health care provider about having an MRI and a mammogram every year.  Breast cancer (BRCA) gene test is recommended for women who have family members with BRCA-related cancers. Results of the assessment will determine the need for genetic counseling and BRCA1 and for BRCA2 testing. BRCA-related cancers include these types: ? Breast. This occurs in males or females. ? Ovarian. ? Tubal. This may also be called fallopian tube cancer. ? Cancer of the abdominal or pelvic lining (peritoneal cancer). ? Prostate. ? Pancreatic. Cervical, Uterine, and Ovarian Cancer Your health care provider may recommend that you be screened regularly for cancer of the pelvic organs. These include your ovaries, uterus, and vagina. This screening involves a pelvic exam, which includes checking for microscopic changes to the surface of your cervix (Pap test).  For women ages 21-65, health care providers may recommend a pelvic exam and a Pap test every three years. For women ages 39-65, they may recommend the Pap test and pelvic exam, combined with testing for human papilloma virus (HPV), every five years. Some types of HPV increase your risk of cervical cancer. Testing for HPV may also be done on women of any age who have unclear Pap test results.  Other health care providers may not recommend any screening for nonpregnant women who are considered low risk for pelvic cancer and have no symptoms. Ask your health care provider if a screening pelvic exam is right for you.  If you have had past treatment for cervical cancer or a condition that could lead to cancer, you need Pap tests and screening for cancer for at least 20 years after your treatment. If Pap tests have been discontinued for you, your risk factors (such as having a new sexual partner) need to be reassessed  to determine if you should start having screenings again. Some women have medical problems that increase the chance of getting cervical cancer. In these cases, your health care provider may recommend that you have screening and Pap tests more often.  If you have a family history of uterine cancer or ovarian cancer, talk with your health care provider about genetic screening.  If you have vaginal bleeding after reaching menopause, tell your health care provider.  There are currently no reliable tests available to screen for ovarian cancer. Lung Cancer Lung cancer screening is recommended for adults 57-50 years old who are at high risk for lung cancer because of a history of smoking. A yearly low-dose CT scan of the lungs is recommended if you:  Currently smoke.  Have a history of at least 30 pack-years of smoking and you currently smoke or have quit within the past 15 years. A pack-year is smoking an average of one pack of cigarettes per day for one year. Yearly screening should:  Continue until it has been 15 years since you quit.  Stop if you develop a health problem that would prevent you from having lung cancer treatment. Colorectal Cancer  This type of cancer can be detected and can often be prevented.  Routine colorectal cancer screening usually begins at age 12 and continues through  age 63.  If you have risk factors for colon cancer, your health care provider may recommend that you be screened at an earlier age.  If you have a family history of colorectal cancer, talk with your health care provider about genetic screening.  Your health care provider may also recommend using home test kits to check for hidden blood in your stool.  A small camera at the end of a tube can be used to examine your colon directly (sigmoidoscopy or colonoscopy). This is done to check for the earliest forms of colorectal cancer.  Direct examination of the colon should be repeated every 5-10 years until  age 75. However, if early forms of precancerous polyps or small growths are found or if you have a family history or genetic risk for colorectal cancer, you may need to be screened more often. Skin Cancer  Check your skin from head to toe regularly.  Monitor any moles. Be sure to tell your health care provider: ? About any new moles or changes in moles, especially if there is a change in a mole's shape or color. ? If you have a mole that is larger than the size of a pencil eraser.  If any of your family members has a history of skin cancer, especially at a young age, talk with your health care provider about genetic screening.  Always use sunscreen. Apply sunscreen liberally and repeatedly throughout the day.  Whenever you are outside, protect yourself by wearing long sleeves, pants, a wide-brimmed hat, and sunglasses. What should I know about osteoporosis? Osteoporosis is a condition in which bone destruction happens more quickly than new bone creation. After menopause, you may be at an increased risk for osteoporosis. To help prevent osteoporosis or the bone fractures that can happen because of osteoporosis, the following is recommended:  If you are 59-59 years old, get at least 1,000 mg of calcium and at least 600 mg of vitamin D per day.  If you are older than age 36 but younger than age 32, get at least 1,200 mg of calcium and at least 600 mg of vitamin D per day.  If you are older than age 47, get at least 1,200 mg of calcium and at least 800 mg of vitamin D per day. Smoking and excessive alcohol intake increase the risk of osteoporosis. Eat foods that are rich in calcium and vitamin D, and do weight-bearing exercises several times each week as directed by your health care provider. What should I know about how menopause affects my mental health? Depression may occur at any age, but it is more common as you become older. Common symptoms of depression include:  Low or sad mood.   Changes in sleep patterns.  Changes in appetite or eating patterns.  Feeling an overall lack of motivation or enjoyment of activities that you previously enjoyed.  Frequent crying spells. Talk with your health care provider if you think that you are experiencing depression. What should I know about immunizations? It is important that you get and maintain your immunizations. These include:  Tetanus, diphtheria, and pertussis (Tdap) booster vaccine.  Influenza every year before the flu season begins.  Pneumonia vaccine.  Shingles vaccine. Your health care provider may also recommend other immunizations. This information is not intended to replace advice given to you by your health care provider. Make sure you discuss any questions you have with your health care provider. Document Released: 05/30/2005 Document Revised: 10/26/2015 Document Reviewed: 01/09/2015 Elsevier Interactive Patient Education  2019 [place].

## 2018-08-26 LAB — PAP, TP IMAGING W/ HPV RNA, RFLX HPV TYPE 16,18/45: HPV DNA High Risk: NOT DETECTED

## 2018-08-27 LAB — URINALYSIS, COMPLETE W/RFL CULTURE
Bilirubin Urine: NEGATIVE
Glucose, UA: NEGATIVE
Hgb urine dipstick: NEGATIVE
Hyaline Cast: NONE SEEN /LPF
Ketones, ur: NEGATIVE
Nitrites, Initial: NEGATIVE
Protein, ur: NEGATIVE
RBC / HPF: NONE SEEN /HPF (ref 0–2)
Specific Gravity, Urine: 1.018 (ref 1.001–1.03)
pH: 7 (ref 5.0–8.0)

## 2018-08-27 LAB — CULTURE INDICATED

## 2018-08-27 LAB — URINE CULTURE
MICRO NUMBER:: 453623
SPECIMEN QUALITY:: ADEQUATE

## 2018-09-15 IMAGING — RF DG UGI W/ HIGH DENSITY W/KUB
7 series · 15 of 23 positions shown · non-contrast
Comparison: None.

CLINICAL DATA: Morbid obesity preop evaluation

EXAM:
UPPER GI SERIES WITH KUB
TECHNIQUE: After obtaining a scout radiograph a routine upper GI series was
performed using thin and high density barium.
FLUOROSCOPY TIME:  Fluoroscopy Time:  1 minutes 42 second
Radiation Exposure Index (if provided by the fluoroscopic device):
Number of Acquired Spot Images: 0

[Series 1: one shot · 1 of 1 slices shown (1 of 3)]
[im 1/1]
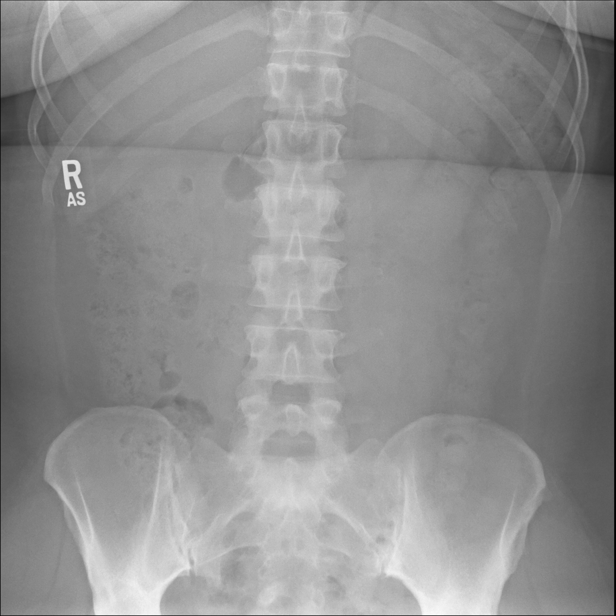

[Series 2: sequence · 2 of 27 frames shown (1 of 4)]
[frame 14/27]
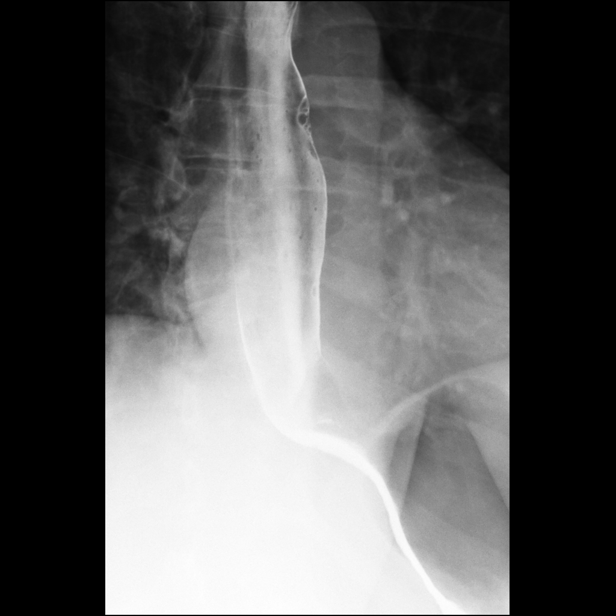
[frame 20/27]
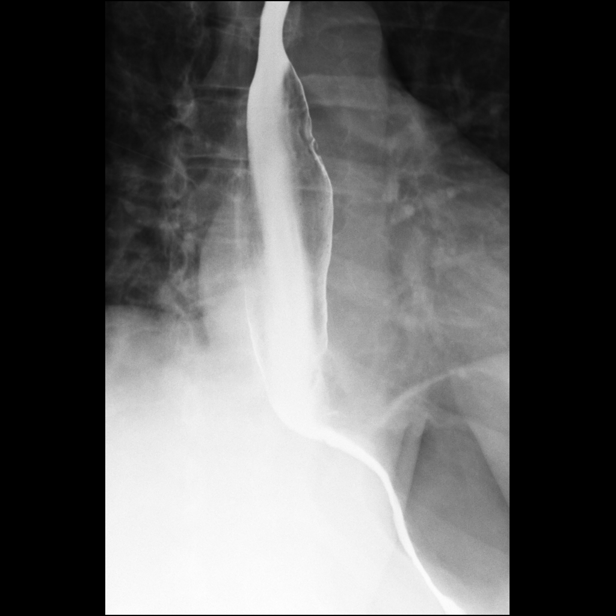

[Series 3: one shot · 3 of 4 slices shown (2 of 3)]
[im 1/4]
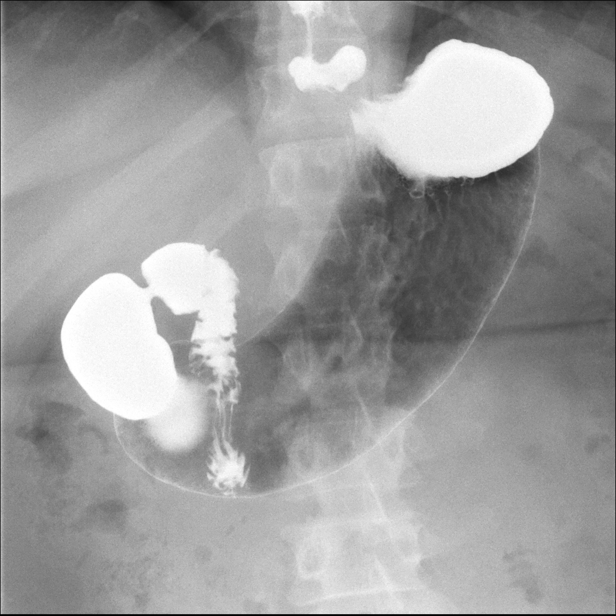
[im 2/4]
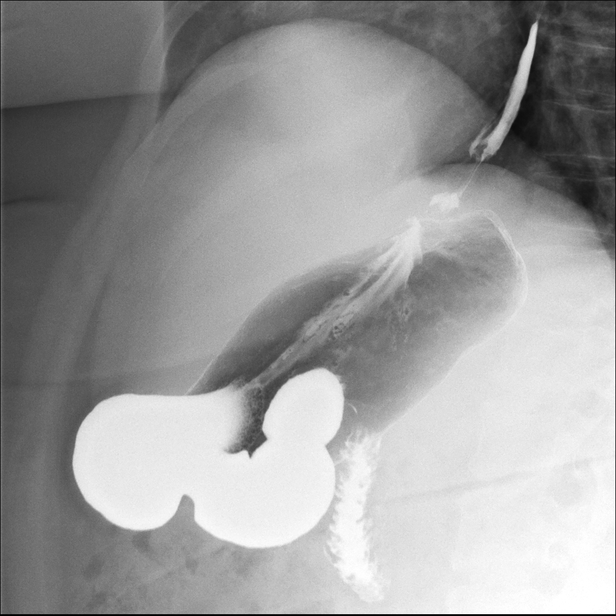
[im 4/4]
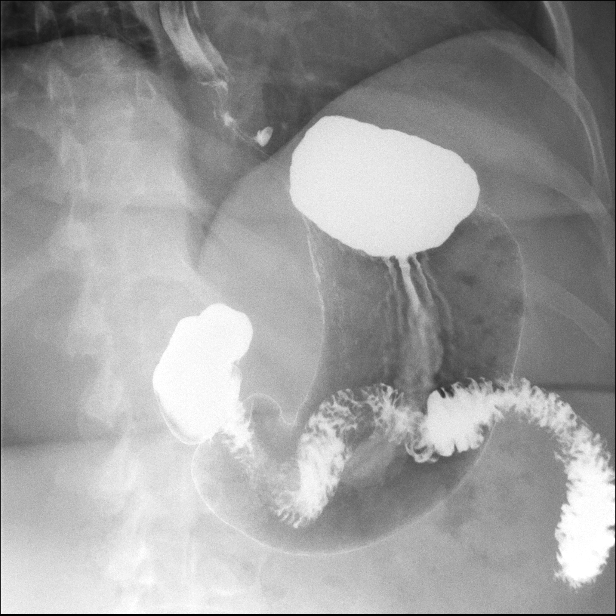

[Series 4: sequence · 2 of 22 frames shown (2 of 4)]
[frame 4/22]
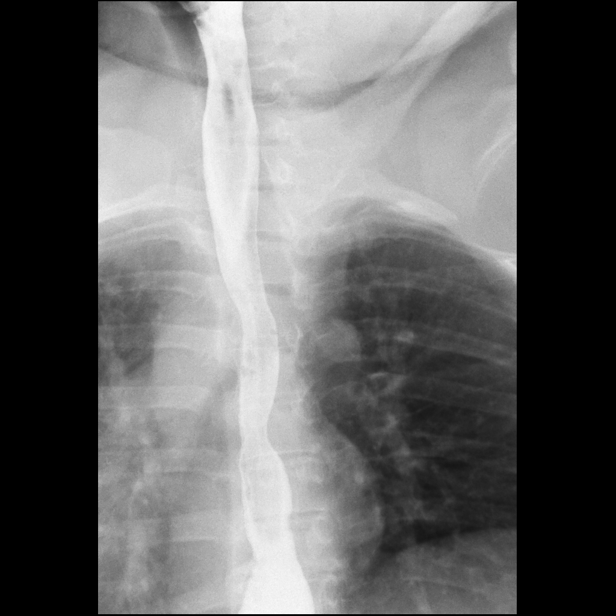
[frame 19/22]
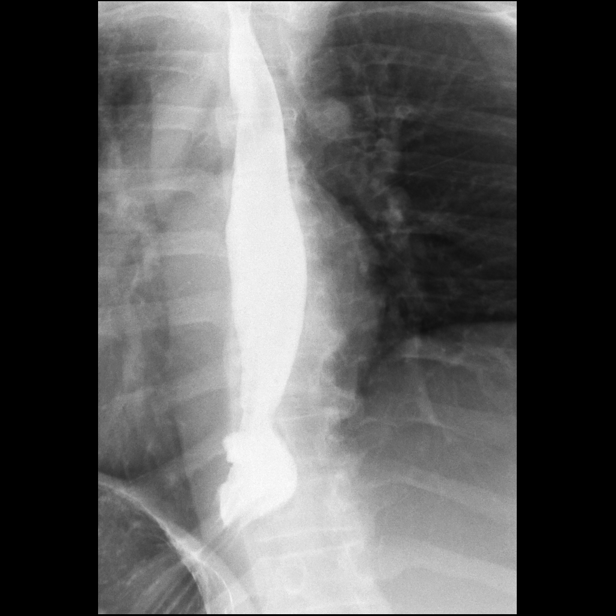

[Series 5: sequence · 3 of 9 frames shown (3 of 4)]
[frame 1/9]
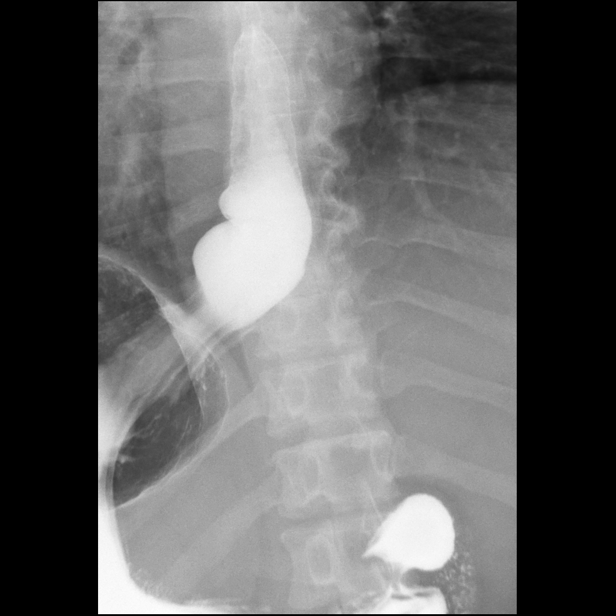
[frame 2/9]
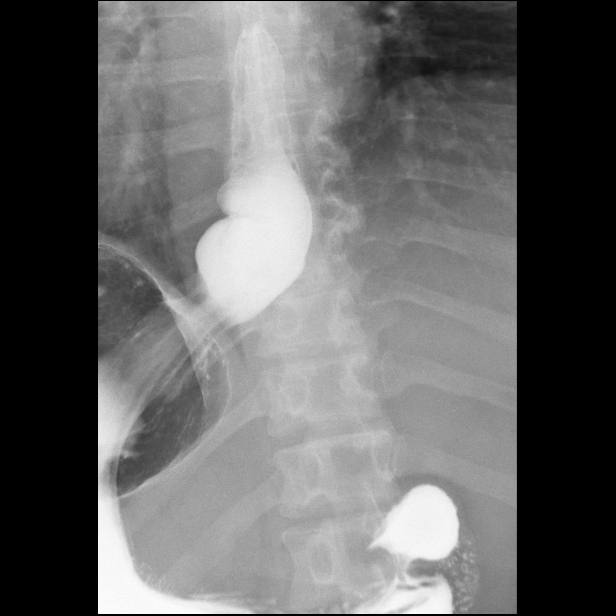
[frame 8/9]
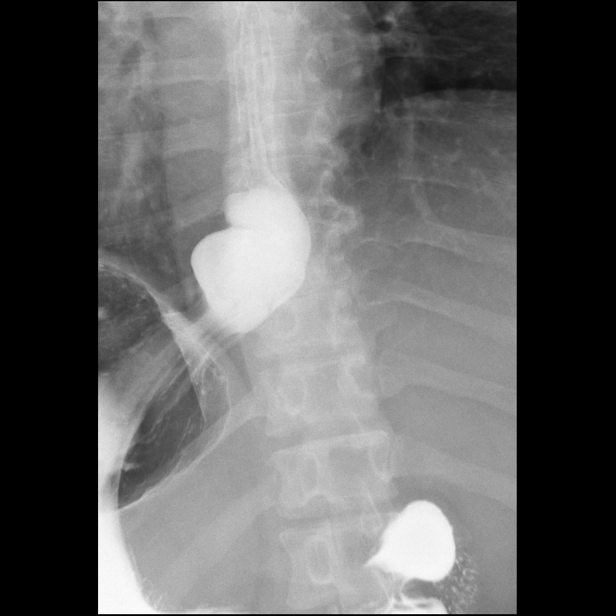

[Series 6: sequence · 1 of 2 frames shown (4 of 4)]
[frame 1/2]
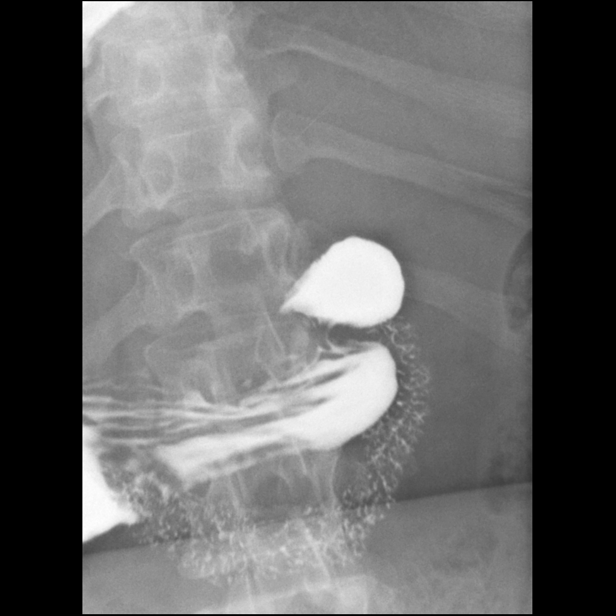

[Series 7: one shot · 3 of 4 slices shown (3 of 3)]
[im 1/4]
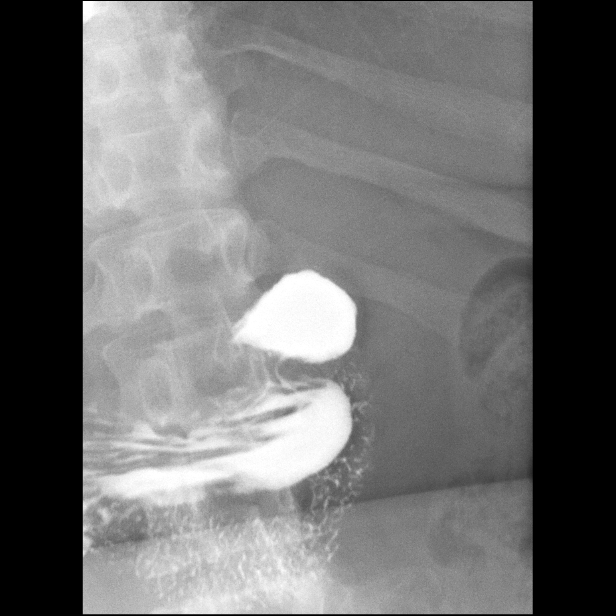
[im 2/4]
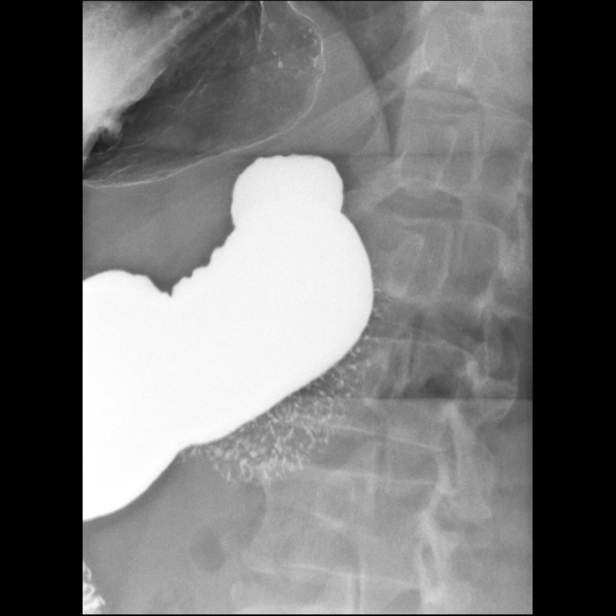
[im 4/4]
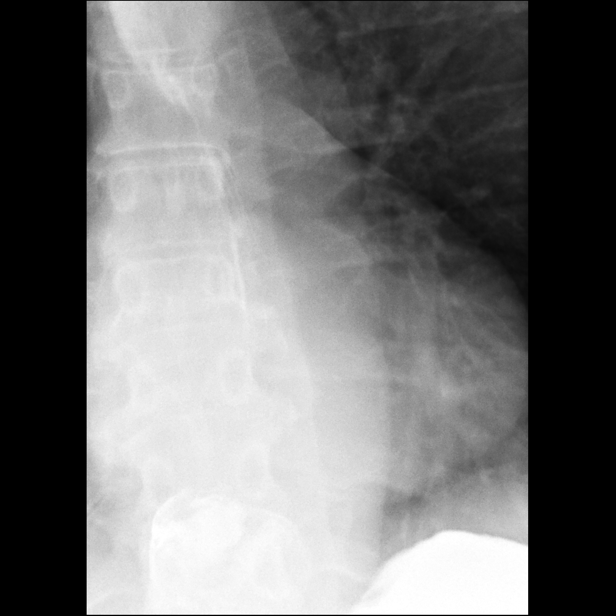

[15 of 23 positions shown; findings below may reference images not displayed]

FINDINGS: Esophageal mucosa and motility normal. No stricture or mass. Small
hiatal hernia with mild gastroesophageal reflux.

Normal stomach. Normal duodenal bulb. No ulcer or edema in the
stomach. Negative for mass lesion.

Preliminary KUB negative.
IMPRESSION: Hiatal hernia with mild gastroesophageal reflux. Normal stomach and
duodenum.

## 2019-04-05 DIAGNOSIS — N62 Hypertrophy of breast: Secondary | ICD-10-CM | POA: Insufficient documentation

## 2019-04-05 DIAGNOSIS — L987 Excessive and redundant skin and subcutaneous tissue: Secondary | ICD-10-CM | POA: Insufficient documentation

## 2019-04-10 DIAGNOSIS — L658 Other specified nonscarring hair loss: Secondary | ICD-10-CM | POA: Insufficient documentation

## 2020-01-03 ENCOUNTER — Encounter: Payer: Managed Care, Other (non HMO) | Admitting: Obstetrics and Gynecology

## 2020-01-27 ENCOUNTER — Encounter: Payer: Managed Care, Other (non HMO) | Admitting: Obstetrics and Gynecology

## 2020-02-28 ENCOUNTER — Other Ambulatory Visit: Payer: Self-pay

## 2020-03-07 ENCOUNTER — Ambulatory Visit: Payer: Managed Care, Other (non HMO) | Admitting: Internal Medicine

## 2020-03-09 ENCOUNTER — Other Ambulatory Visit: Payer: Self-pay | Admitting: Family Medicine

## 2020-03-09 DIAGNOSIS — R0989 Other specified symptoms and signs involving the circulatory and respiratory systems: Secondary | ICD-10-CM

## 2020-03-27 ENCOUNTER — Ambulatory Visit
Admission: RE | Admit: 2020-03-27 | Discharge: 2020-03-27 | Disposition: A | Discharge status: Home / Self Care | Payer: Managed Care, Other (non HMO) | Source: Ambulatory Visit | Attending: Family Medicine | Admitting: Family Medicine

## 2020-03-27 DIAGNOSIS — R0989 Other specified symptoms and signs involving the circulatory and respiratory systems: Secondary | ICD-10-CM

## 2021-03-20 ENCOUNTER — Ambulatory Visit: Payer: Self-pay | Admitting: Obstetrics & Gynecology

## 2021-11-27 ENCOUNTER — Other Ambulatory Visit (HOSPITAL_BASED_OUTPATIENT_CLINIC_OR_DEPARTMENT_OTHER): Payer: Self-pay | Admitting: Family Medicine

## 2021-11-27 DIAGNOSIS — R1032 Left lower quadrant pain: Secondary | ICD-10-CM

## 2021-11-28 ENCOUNTER — Encounter (HOSPITAL_BASED_OUTPATIENT_CLINIC_OR_DEPARTMENT_OTHER): Payer: Self-pay

## 2021-11-28 ENCOUNTER — Encounter: Payer: Self-pay | Admitting: Physician Assistant

## 2021-11-28 ENCOUNTER — Ambulatory Visit (HOSPITAL_BASED_OUTPATIENT_CLINIC_OR_DEPARTMENT_OTHER)
Admission: RE | Admit: 2021-11-28 | Discharge: 2021-11-28 | Disposition: A | Discharge status: Home / Self Care | Payer: BC Managed Care – PPO | Source: Ambulatory Visit | Attending: Family Medicine | Admitting: Family Medicine

## 2021-11-28 DIAGNOSIS — R1032 Left lower quadrant pain: Secondary | ICD-10-CM | POA: Insufficient documentation

## 2021-11-28 MED ORDER — IOHEXOL 300 MG/ML  SOLN
100.0000 mL | Freq: Once | INTRAMUSCULAR | Status: AC | PRN
  Administered 2021-11-28: 85 mL via INTRAVENOUS

## 2021-12-31 ENCOUNTER — Ambulatory Visit: Payer: BC Managed Care – PPO | Admitting: Physician Assistant

## 2022-01-22 ENCOUNTER — Other Ambulatory Visit (HOSPITAL_COMMUNITY)
Admission: RE | Admit: 2022-01-22 | Discharge: 2022-01-22 | Disposition: A | Discharge status: Home / Self Care | Payer: BC Managed Care – PPO | Source: Ambulatory Visit | Attending: Obstetrics and Gynecology | Admitting: Obstetrics and Gynecology

## 2022-01-22 ENCOUNTER — Ambulatory Visit (INDEPENDENT_AMBULATORY_CARE_PROVIDER_SITE_OTHER): Payer: BC Managed Care – PPO | Admitting: Obstetrics and Gynecology

## 2022-01-22 ENCOUNTER — Encounter: Payer: Self-pay | Admitting: Obstetrics and Gynecology

## 2022-01-22 VITALS — BP 128/74 | HR 85 | Ht 64.57 in | Wt 210.0 lb

## 2022-01-22 DIAGNOSIS — Z124 Encounter for screening for malignant neoplasm of cervix: Secondary | ICD-10-CM | POA: Insufficient documentation

## 2022-01-22 DIAGNOSIS — Z01419 Encounter for gynecological examination (general) (routine) without abnormal findings: Secondary | ICD-10-CM

## 2022-01-22 DIAGNOSIS — N950 Postmenopausal bleeding: Secondary | ICD-10-CM | POA: Diagnosis not present

## 2022-01-22 NOTE — Progress Notes (Signed)
56 y.o. G1P1001 Single Other or two or more races Not Hispanic or Latino female here for annual exam.   She has had light pink discharge when wiping before.   Occasional constipation, helped with miralax. She was recently treated for diverticulitis.   No bladder c/o.  She had a gastric sleeve and lost 125 lbs. She has gained back ~15 lbs. She is walking 3 x a week. Eating healthy.    Lost her Mom and Dad in the last year.   No LMP recorded. (Menstrual status: Perimenopausal).          Sexually active: No.  The current method of family planning is abstinence.    Exercising: Yes.     Walking 3x a week  Smoker:  no  Health Maintenance: Pap:  08/25/18 normal Hr hpv Neg; 06/04/2015  normal Hr HPV neg  History of abnormal Pap:  yes repeat was normal  MMG:  She states that she had one in Jan at [facility] will request report. Patient reports it was normal  BMD:   n/a Colonoscopy: 08/26/2013 diverticulosis f/u 10 years  TDaP:  04/28/2014 Gardasil:  n/a   reports that she has never smoked. She has been exposed to tobacco smoke. She has never used smokeless tobacco. She reports current alcohol use. She reports that she does not use drugs. Social ETOH, occasional. She is a school Education officer, museum. Son is 7, no grandchildren.   Past Medical History:  Diagnosis Date   Asthma    Chronic headaches    Hypercholesteremia    Hypothyroidism     Past Surgical History:  Procedure Laterality Date   BARTHOLIN CYST MARSUPIALIZATION     BREAST ENHANCEMENT SURGERY  2020   paraesophageal hernia repair     SLEEVE GASTROPLASTY      Current Outpatient Medications  Medication Sig Dispense Refill   albuterol (PROVENTIL HFA;VENTOLIN HFA) 108 (90 BASE) MCG/ACT inhaler Inhale 2 puffs into the lungs every 6 (six) hours as needed.     levothyroxine (SYNTHROID) 100 MCG tablet Take 1 tablet by mouth daily.     loratadine (CLARITIN) 10 MG tablet Take 1 tablet by mouth daily.     senna-docusate (SENOKOT-S) 8.6-50 MG  tablet Take 1 tablet by mouth 2 (two) times daily.     valACYclovir (VALTREX) 500 MG tablet Take twice daily for 3-5 days as needed 30 tablet 12   No current facility-administered medications for this visit.    Family History  Problem Relation Age of Onset   Hypertension Mother    Diabetes Sister    Hypertension Sister    Colon polyps Father    Lung cancer Maternal Grandfather        smoker   Prostate cancer Paternal Grandfather     Review of Systems  Genitourinary:  Positive for vaginal bleeding.  Psychiatric/Behavioral:  The patient is nervous/anxious.   All other systems reviewed and are negative.   Exam:   BP 128/74   Pulse 85   Ht 5' 4.57" (1.64 m)   Wt 210 lb (95.3 kg)   SpO2 100%   BMI 35.42 kg/m   Weight change: '@WEIGHTCHANGE'$ @ Height:   Height: 5' 4.57" (164 cm)  Ht Readings from Last 3 Encounters:  01/22/22 5' 4.57" (1.64 m)  08/25/18 '5\' 5"'$  (1.651 m)  07/15/17 '5\' 5"'$  (1.651 m)    General appearance: alert, cooperative and appears stated age Head: Normocephalic, without obvious abnormality, atraumatic Neck: no adenopathy, supple, symmetrical, trachea midline and thyroid normal to  inspection and palpation Lungs: clear to auscultation bilaterally Cardiovascular: regular rate and rhythm Breasts: normal appearance, no masses or tenderness Abdomen: soft, non-tender; non distended,  no masses,  no organomegaly Extremities: extremities normal, atraumatic, no cyanosis or edema Skin: Skin color, texture, turgor normal. No rashes or lesions Lymph nodes: Cervical, supraclavicular, and axillary nodes normal. No abnormal inguinal nodes palpated Neurologic: Grossly normal   Pelvic: External genitalia:  no lesions              Urethra:  normal appearing urethra with no masses, tenderness or lesions              Bartholins and Skenes: normal                 Vagina: normal appearing vagina with normal color and discharge, no lesions              Cervix: no lesions                Bimanual Exam:  Uterus:  normal size, contour, position, consistency, mobility, non-tender and anteverted              Adnexa: no mass, fullness, tenderness               Rectovaginal: Confirms               Anus:  normal sphincter tone, no lesions  Gae Dry, CMA chaperoned for the exam.  1. Well woman exam Discussed breast self exam Discussed calcium and vit D intake Labs with primary Mammogram UTD and normal (per patient) Colonoscopy UTD  2. Postmenopausal bleeding Normal exam - US PELVIS TRANSVAGINAL NON-OB (TV ONLY); Future  3. Screening for cervical cancer - Cytology - PAP

## 2022-01-22 NOTE — Patient Instructions (Signed)

## 2022-01-23 ENCOUNTER — Ambulatory Visit (INDEPENDENT_AMBULATORY_CARE_PROVIDER_SITE_OTHER): Payer: BC Managed Care – PPO

## 2022-01-23 DIAGNOSIS — N950 Postmenopausal bleeding: Secondary | ICD-10-CM

## 2022-01-24 LAB — CYTOLOGY - PAP
Comment: NEGATIVE
Diagnosis: UNDETERMINED — AB
High risk HPV: NEGATIVE

## 2022-01-27 ENCOUNTER — Telehealth: Payer: Self-pay

## 2022-01-27 NOTE — Telephone Encounter (Signed)
Patient had u/s 01/23/22 and is waiting to hear about result. I called her with pap smear result and she mentioned this.

## 2022-01-27 NOTE — Telephone Encounter (Signed)
Please let the patient know that she does have what appears to be a small intracavitary defect c/w a polyp.  This is managed with a hysteroscopy, D&C. This is an outpatient procedure, done in the surgery center. There are no incisions and she can return to work the next day. Please get her an appointment to discuss. You can add her to my schedule on Thursday at 9:15 if she is able to make that.

## 2022-01-28 NOTE — Telephone Encounter (Signed)
It is completely up to her. If she is comfortable with me scheduling the procedure and reviewing things at  her preop visit that is fine. If she wants to do a virtual visit this Thursday, that is also fine. Just please let me know.

## 2022-01-28 NOTE — Telephone Encounter (Signed)
I spoke with patient and read her Dr. Gentry Fitz reply.  She asked can we not go ahead and schedule the outpatient surgey without the visit to discuss.  She said she does not work in [employer] and she starts work at 7:30am.  I asked her about a video visit and she was agreeable to that.  I told her I will check with Dr. Talbert Nan if video visit okay and if she is able to do that at 9:15 slot on Thursday.

## 2022-01-29 NOTE — Telephone Encounter (Signed)
Spoke with patient. She is very comfortable with proceeding with scheduling surgery and then discussing at pre op visit.  I told her Sharee Pimple, RN will be in touch with her after she receives Dr. Gentry Fitz surgery request/order.

## 2022-01-29 NOTE — Telephone Encounter (Signed)
Surgery: CPT [phone] - Hysteroscopy/D&C/Myosure,  Diagnosis: N95.0 Postmenopausal Bleeding  Location: [facility]  Status: Outpatient  Time: 30 Minutes  Assistant: N/A  Urgency: At Patient's Convenience  Pre-Op Appointment: To Be Scheduled  Post-Op Appointment(s): 1 Week  Time Out Of Work: Day Of Surgery ONLY

## 2022-01-30 NOTE — Telephone Encounter (Signed)
Spoke with patient. Reviewed surgery dates. Patient request to proceed with surgery on 02/11/22.  Advised patient I will forward to business office for return call. I will return call once surgery date and time confirmed. Patient verbalizes understanding and is agreeable.   Surgery request sent.

## 2022-02-03 ENCOUNTER — Encounter (HOSPITAL_BASED_OUTPATIENT_CLINIC_OR_DEPARTMENT_OTHER): Payer: Self-pay | Admitting: Obstetrics and Gynecology

## 2022-02-03 NOTE — Progress Notes (Signed)
Spoke w/ via phone for pre-op interview--- Erika Robles----  UPT             Lab results------ COVID test -----patient states asymptomatic no test needed Arrive at -------0530 NPO after MN NO Solid Food.  Med rec completed Medications to take morning of surgery -----Synthroid Diabetic medication ----- Patient instructed no nail polish to be worn day of surgery Patient instructed to bring photo id and insurance card day of surgery Patient aware to have Driver (ride ) / caregiver  Son Erika Robles  for 24 hours after surgery  Patient Special Instructions ----- Pre-Op special Istructions ----- Patient verbalized understanding of instructions that were given at this phone interview. Patient denies shortness of breath, chest pain, fever, cough at this phone interview.   PATIENT SON IS RIDE AND CAREGIVER, PATIENT REQUESTS DISCHARGE INSTRUCTIONS TO BE GONE OVER WITH HER ONLY.

## 2022-02-04 NOTE — Telephone Encounter (Signed)
Spoke with patient regarding surgery benefits. Patient acknowledges understanding of information presented. Patient is aware that benefits presented are professional benefits only. Patient is aware the hospital will call with facility benefits. See account note.  Routing to Jill Hamm, RN.  

## 2022-02-05 NOTE — Telephone Encounter (Signed)
Spoke with patient. Surgery date request confirmed.  Advised surgery is scheduled for 02/11/22, [place] at 69. Surgery instruction sheet and hospital brochure reviewed, printed copy will be emailed per patient request, e-mail address confirmed.  Patient verbalizes understanding and is agreeable.  Routing to provider. Encounter closed.

## 2022-02-10 ENCOUNTER — Ambulatory Visit (INDEPENDENT_AMBULATORY_CARE_PROVIDER_SITE_OTHER): Payer: BC Managed Care – PPO | Admitting: Obstetrics and Gynecology

## 2022-02-10 ENCOUNTER — Encounter: Payer: Self-pay | Admitting: Obstetrics and Gynecology

## 2022-02-10 VITALS — BP 128/72 | HR 72 | Ht 64.75 in | Wt 209.0 lb

## 2022-02-10 DIAGNOSIS — N950 Postmenopausal bleeding: Secondary | ICD-10-CM

## 2022-02-10 MED ORDER — MISOPROSTOL 200 MCG PO TABS: ORAL_TABLET | ORAL | 0 refills | Status: DC

## 2022-02-10 NOTE — Progress Notes (Signed)
GYNECOLOGY  VISIT   HPI: 56 y.o.   Single Other or two or more races Not Hispanic or Latino  female   G1P1001 with No LMP recorded. (Menstrual status: Perimenopausal).   here for  a preoperative visit. She presented last month with postmenopausal spotting.   Pap: ASCUS, negative hpv  Ultrasound impression: Small anteverted uterus with a small intramural myoma Thin endometrium, fluid noted in the endometrial cavity with a suspected endometrial polyp Normal ovaries bilaterally  GYNECOLOGIC HISTORY: No LMP recorded. (Menstrual status: Perimenopausal). 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   Seasonal allergies 08/20/2015   UTI (lower urinary tract infection) 05/26/2012   Hypothyroid 01/07/2012   Hypercholesteremia 01/07/2012   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/2012   Morbid obesity with BMI of 45.0-49.9, adult ([place]) 01/07/2012    Past Medical History:  Diagnosis Date   Asthma    Chronic headaches    Hypercholesteremia    Hypothyroidism     Past Surgical History:  Procedure Laterality Date   BARTHOLIN CYST MARSUPIALIZATION     BREAST ENHANCEMENT SURGERY  2020   paraesophageal hernia repair     SLEEVE GASTROPLASTY      Current Outpatient Medications  Medication Sig Dispense Refill   albuterol (PROVENTIL HFA;VENTOLIN HFA) 108 (90 BASE) MCG/ACT inhaler Inhale 2 puffs into the lungs every 6 (six) hours as needed.      levothyroxine (SYNTHROID) 100 MCG tablet Take 1 tablet by mouth daily.     loratadine (CLARITIN) 10 MG tablet Take 1 tablet by mouth daily.     senna-docusate (SENOKOT-S) 8.6-50 MG tablet Take 1 tablet by mouth 2 (two) times daily.     valACYclovir (VALTREX) 500 MG tablet Take twice daily for 3-5 days as needed 30 tablet 12   No current facility-administered medications for this visit.     ALLERGIES: Atorvastatin  Family History  Problem Relation Age of Onset   Hypertension Mother    Diabetes Sister    Hypertension Sister    Colon polyps Father    Lung cancer Maternal Grandfather        smoker   Prostate cancer Paternal Grandfather     Social History   Socioeconomic History   Marital status: Single    Spouse name: Not on file   Number of children: 1   Years of education: Not on file   Highest education level: Not on file  Occupational History   Occupation: Best boy: [employer]  Tobacco Use   Smoking status: Never    Passive exposure: Past   Smokeless tobacco: Never   Tobacco comments:    Patient was  around smokers in [employer]   Vaping Use   Vaping Use: Never used  Substance and Sexual Activity   Alcohol use: Yes    Comment: occ maybe once a month   Drug use: No   Sexual activity: Not Currently    Partners: Male    Birth control/protection: Pill  Other Topics Concern   Not on file  Social History Narrative   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 One caffeinated beverage daily. Updated 07/19/2013   Social Determinants of Health   Financial Resource Strain: Not on file  Food Insecurity: Not on file  Transportation Needs: Not on file  Physical Activity: Not on file  Stress: Not on file  Social Connections: Not on file  Intimate Partner Violence: Not on file    ROS  PHYSICAL EXAMINATION:    BP 128/72   Pulse 72   Ht 5' 4.75" (1.645 m)   Wt 209 lb (94.8 kg)   SpO2 100%   BMI 35.05 kg/m     General appearance:  alert, cooperative and appears stated age Neck: no adenopathy, supple, symmetrical, trachea midline and thyroid normal to inspection and palpation Heart: regular rate and rhythm Lungs: CTAB Abdomen: soft, non-tender; bowel sounds normal; no masses,  no organomegaly Extremities: normal, atraumatic, no cyanosis Skin: normal color, texture and turgor, no rashes or lesions Lymph: normal cervical supraclavicular and inguinal nodes Neurologic: grossly normal  1. Postmenopausal bleeding Ultrasound concerning for an endometrial polyp Plan: hysteroscopy, dilation and curettage. Reviewed risks, including: bleeding, infection, uterine perforation, fluid overload, need for further sugery -will pretreat with cytotec

## 2022-02-10 NOTE — H&P (View-Only) (Signed)
GYNECOLOGY  VISIT   HPI: 56 y.o.   Single Other or two or more races Not Hispanic or Latino  female   G1P1001 with No LMP recorded. (Menstrual status: Perimenopausal).   here for  a preoperative visit. She presented last month with postmenopausal spotting.   Pap: ASCUS, negative hpv  Ultrasound impression: Small anteverted uterus with a small intramural myoma Thin endometrium, fluid noted in the endometrial cavity with a suspected endometrial polyp Normal ovaries bilaterally  GYNECOLOGIC HISTORY: No LMP recorded. (Menstrual status: Perimenopausal). 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   Seasonal allergies 08/20/2015   UTI (lower urinary tract infection) 05/26/2012   Hypothyroid 01/07/2012   Hypercholesteremia 01/07/2012   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      levothyroxine (SYNTHROID) 100 MCG tablet Take 1 tablet by mouth daily.     loratadine (CLARITIN) 10 MG tablet Take 1 tablet by mouth daily.     senna-docusate (SENOKOT-S) 8.6-50 MG tablet Take 1 tablet by mouth 2 (two) times daily.     valACYclovir (VALTREX) 500 MG tablet Take twice daily for 3-5 days as needed 30 tablet 12   No current facility-administered medications for this visit.     ALLERGIES: Atorvastatin  Family History  Problem Relation Age of Onset   Hypertension Mother    Diabetes Sister    Hypertension Sister    Colon polyps Father    Lung cancer Maternal Grandfather        smoker   Prostate cancer Paternal Grandfather     Social History   Socioeconomic History   Marital status: Single    Spouse name: Not on file   Number of children: 1   Years of education: Not on file   Highest education level: Not on file  Occupational History   Occupation: Best boy: [employer]  Tobacco Use   Smoking status: Never    Passive exposure: Past   Smokeless tobacco: Never   Tobacco comments:    Patient was  around smokers in [employer]   Vaping Use   Vaping Use: Never used  Substance and Sexual Activity   Alcohol use: Yes    Comment: occ maybe once a month   Drug use: No   Sexual activity: Not Currently    Partners: Male    Birth control/protection: Pill  Other Topics Concern   Not on file  Social History Narrative   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 One caffeinated beverage daily. Updated 07/19/2013   Social Determinants of Health   Financial Resource Strain: Not on file  Food Insecurity: Not on file  Transportation Needs: Not on file  Physical Activity: Not on file  Stress: Not on file  Social Connections: Not on file  Intimate Partner Violence: Not on file    ROS  PHYSICAL EXAMINATION:    BP 128/72   Pulse 72   Ht 5' 4.75" (1.645 m)   Wt 209 lb (94.8 kg)   SpO2 100%   BMI 35.05 kg/m     General appearance:  alert, cooperative and appears stated age Neck: no adenopathy, supple, symmetrical, trachea midline and thyroid normal to inspection and palpation Heart: regular rate and rhythm Lungs: CTAB Abdomen: soft, non-tender; bowel sounds normal; no masses,  no organomegaly Extremities: normal, atraumatic, no cyanosis Skin: normal color, texture and turgor, no rashes or lesions Lymph: normal cervical supraclavicular and inguinal nodes Neurologic: grossly normal  1. Postmenopausal bleeding Ultrasound concerning for an endometrial polyp Plan: hysteroscopy, dilation and curettage. Reviewed risks, including: bleeding, infection, uterine perforation, fluid overload, need for further sugery -will pretreat with cytotec

## 2022-02-11 ENCOUNTER — Ambulatory Visit (HOSPITAL_BASED_OUTPATIENT_CLINIC_OR_DEPARTMENT_OTHER): Payer: BC Managed Care – PPO | Admitting: Anesthesiology

## 2022-02-11 ENCOUNTER — Encounter (HOSPITAL_BASED_OUTPATIENT_CLINIC_OR_DEPARTMENT_OTHER): Payer: Self-pay | Admitting: Obstetrics and Gynecology

## 2022-02-11 ENCOUNTER — Other Ambulatory Visit: Payer: Self-pay

## 2022-02-11 ENCOUNTER — Encounter (HOSPITAL_BASED_OUTPATIENT_CLINIC_OR_DEPARTMENT_OTHER)
Admission: RE | Disposition: A | Discharge status: Home / Self Care | Payer: Self-pay | Source: Home / Self Care | Attending: Obstetrics and Gynecology

## 2022-02-11 ENCOUNTER — Ambulatory Visit (HOSPITAL_BASED_OUTPATIENT_CLINIC_OR_DEPARTMENT_OTHER)
Admission: RE | Admit: 2022-02-11 | Discharge: 2022-02-11 | Disposition: A | Discharge status: Home / Self Care | Payer: BC Managed Care – PPO | Attending: Obstetrics and Gynecology | Admitting: Obstetrics and Gynecology

## 2022-02-11 DIAGNOSIS — N950 Postmenopausal bleeding: Secondary | ICD-10-CM | POA: Insufficient documentation

## 2022-02-11 DIAGNOSIS — N840 Polyp of corpus uteri: Secondary | ICD-10-CM | POA: Insufficient documentation

## 2022-02-11 DIAGNOSIS — N841 Polyp of cervix uteri: Secondary | ICD-10-CM | POA: Insufficient documentation

## 2022-02-11 DIAGNOSIS — Z01818 Encounter for other preprocedural examination: Secondary | ICD-10-CM

## 2022-02-11 DIAGNOSIS — D251 Intramural leiomyoma of uterus: Secondary | ICD-10-CM | POA: Insufficient documentation

## 2022-02-11 DIAGNOSIS — E039 Hypothyroidism, unspecified: Secondary | ICD-10-CM | POA: Insufficient documentation

## 2022-02-11 HISTORY — PX: DILATATION & CURETTAGE/HYSTEROSCOPY WITH MYOSURE: SHX6511

## 2022-02-11 LAB — POCT PREGNANCY, URINE: Preg Test, Ur: NEGATIVE

## 2022-02-11 SURGERY — DILATATION & CURETTAGE/HYSTEROSCOPY WITH MYOSURE
Anesthesia: General | Site: Uterus

## 2022-02-11 MED ORDER — MIDAZOLAM HCL 5 MG/5ML IJ SOLN
INTRAMUSCULAR | Status: DC | PRN
  Administered 2022-02-11: 2 mg via INTRAVENOUS

## 2022-02-11 MED ORDER — OXYCODONE HCL 5 MG/5ML PO SOLN: 5.0000 mg | Freq: Once | ORAL | Status: DC | PRN

## 2022-02-11 MED ORDER — KETOROLAC TROMETHAMINE 30 MG/ML IJ SOLN
INTRAMUSCULAR | Status: DC | PRN
  Administered 2022-02-11: 30 mg via INTRAVENOUS

## 2022-02-11 MED ORDER — FENTANYL CITRATE (PF) 100 MCG/2ML IJ SOLN: 25.0000 ug | INTRAMUSCULAR | Status: DC | PRN

## 2022-02-11 MED ORDER — MIDAZOLAM HCL 2 MG/2ML IJ SOLN
INTRAMUSCULAR | Status: AC
  Filled 2022-02-11: qty 2

## 2022-02-11 MED ORDER — DEXAMETHASONE SODIUM PHOSPHATE 10 MG/ML IJ SOLN
INTRAMUSCULAR | Status: DC | PRN
  Administered 2022-02-11: 10 mg via INTRAVENOUS

## 2022-02-11 MED ORDER — FENTANYL CITRATE (PF) 100 MCG/2ML IJ SOLN
INTRAMUSCULAR | Status: AC
  Filled 2022-02-11: qty 2

## 2022-02-11 MED ORDER — DEXAMETHASONE SODIUM PHOSPHATE 10 MG/ML IJ SOLN
INTRAMUSCULAR | Status: AC
  Filled 2022-02-11: qty 1

## 2022-02-11 MED ORDER — ONDANSETRON HCL 4 MG/2ML IJ SOLN
INTRAMUSCULAR | Status: AC
  Filled 2022-02-11: qty 2

## 2022-02-11 MED ORDER — ONDANSETRON HCL 4 MG/2ML IJ SOLN
INTRAMUSCULAR | Status: DC | PRN
  Administered 2022-02-11: 4 mg via INTRAVENOUS

## 2022-02-11 MED ORDER — LACTATED RINGERS IV SOLN: INTRAVENOUS | Status: DC

## 2022-02-11 MED ORDER — ACETAMINOPHEN 500 MG PO TABS
ORAL_TABLET | ORAL | Status: AC
  Filled 2022-02-11: qty 2

## 2022-02-11 MED ORDER — KETOROLAC TROMETHAMINE 30 MG/ML IJ SOLN: 30.0000 mg | Freq: Once | INTRAMUSCULAR | Status: DC | PRN

## 2022-02-11 MED ORDER — LIDOCAINE 2% (20 MG/ML) 5 ML SYRINGE
INTRAMUSCULAR | Status: DC | PRN
  Administered 2022-02-11: 100 mg via INTRAVENOUS

## 2022-02-11 MED ORDER — ARTIFICIAL TEARS OPHTHALMIC OINT
TOPICAL_OINTMENT | OPHTHALMIC | Status: AC
  Filled 2022-02-11: qty 3.5

## 2022-02-11 MED ORDER — PHENYLEPHRINE 80 MCG/ML (10ML) SYRINGE FOR IV PUSH (FOR BLOOD PRESSURE SUPPORT)
PREFILLED_SYRINGE | INTRAVENOUS | Status: DC | PRN
  Administered 2022-02-11: 80 ug via INTRAVENOUS
  Administered 2022-02-11: 240 ug via INTRAVENOUS
  Administered 2022-02-11 (×2): 80 ug via INTRAVENOUS

## 2022-02-11 MED ORDER — OXYCODONE HCL 5 MG PO TABS: 5.0000 mg | ORAL_TABLET | Freq: Once | ORAL | Status: DC | PRN

## 2022-02-11 MED ORDER — PHENYLEPHRINE 80 MCG/ML (10ML) SYRINGE FOR IV PUSH (FOR BLOOD PRESSURE SUPPORT)
PREFILLED_SYRINGE | INTRAVENOUS | Status: AC
  Filled 2022-02-11: qty 10

## 2022-02-11 MED ORDER — PROPOFOL 10 MG/ML IV BOLUS
INTRAVENOUS | Status: AC
  Filled 2022-02-11: qty 20

## 2022-02-11 MED ORDER — KETOROLAC TROMETHAMINE 30 MG/ML IJ SOLN
INTRAMUSCULAR | Status: AC
  Filled 2022-02-11: qty 1

## 2022-02-11 MED ORDER — PROPOFOL 10 MG/ML IV BOLUS
INTRAVENOUS | Status: DC | PRN
  Administered 2022-02-11: 200 mg via INTRAVENOUS

## 2022-02-11 MED ORDER — FENTANYL CITRATE (PF) 100 MCG/2ML IJ SOLN
INTRAMUSCULAR | Status: DC | PRN
  Administered 2022-02-11 (×2): 50 ug via INTRAVENOUS

## 2022-02-11 MED ORDER — VASOPRESSIN 20 UNIT/ML IV SOLN
INTRAVENOUS | Status: AC
  Filled 2022-02-11: qty 1

## 2022-02-11 MED ORDER — ONDANSETRON HCL 4 MG/2ML IJ SOLN: 4.0000 mg | Freq: Once | INTRAMUSCULAR | Status: DC | PRN

## 2022-02-11 MED ORDER — LIDOCAINE HCL (PF) 2 % IJ SOLN
INTRAMUSCULAR | Status: AC
  Filled 2022-02-11: qty 5

## 2022-02-11 MED ORDER — ACETAMINOPHEN 500 MG PO TABS
1000.0000 mg | ORAL_TABLET | ORAL | Status: AC
  Administered 2022-02-11: 1000 mg via ORAL

## 2022-02-11 SURGICAL SUPPLY — 23 items
CATH ROBINSON RED A/P 16FR (CATHETERS) IMPLANT
CNTNR URN SCR LID CUP LEK RST (MISCELLANEOUS) IMPLANT
CONT SPEC 4OZ STRL OR WHT (MISCELLANEOUS) ×2
DEVICE MYOSURE LITE (MISCELLANEOUS) IMPLANT
DEVICE MYOSURE REACH (MISCELLANEOUS) IMPLANT
DILATOR CANAL MILEX (MISCELLANEOUS) IMPLANT
DRSG TELFA 3X8 NADH STRL (GAUZE/BANDAGES/DRESSINGS) ×2 IMPLANT
GAUZE 4X4 16PLY ~~LOC~~+RFID DBL (SPONGE) ×4 IMPLANT
GLOVE BIO SURGEON STRL SZ 6.5 (GLOVE) ×2 IMPLANT
GOWN STRL REUS W/TWL LRG LVL3 (GOWN DISPOSABLE) ×2 IMPLANT
IV NS IRRIG 3000ML ARTHROMATIC (IV SOLUTION) ×2 IMPLANT
KIT PROCEDURE FLUENT (KITS) ×2 IMPLANT
KIT TURNOVER CYSTO (KITS) ×2 IMPLANT
MYOSURE XL FIBROID (MISCELLANEOUS)
PACK VAGINAL MINOR WOMEN LF (CUSTOM PROCEDURE TRAY) ×2 IMPLANT
PAD OB MATERNITY 4.3X12.25 (PERSONAL CARE ITEMS) ×2 IMPLANT
PAD PREP 24X48 CUFFED NSTRL (MISCELLANEOUS) ×2 IMPLANT
SEAL CERVICAL OMNI LOK (ABLATOR) IMPLANT
SEAL ROD LENS SCOPE MYOSURE (ABLATOR) ×2 IMPLANT
SYR 20ML LL LF (SYRINGE) IMPLANT
SYSTEM TISS REMOVAL MYOSURE XL (MISCELLANEOUS) IMPLANT
TOWEL OR 17X26 10 PK STRL BLUE (TOWEL DISPOSABLE) ×2 IMPLANT
WATER STERILE IRR 500ML POUR (IV SOLUTION) IMPLANT

## 2022-02-11 NOTE — Op Note (Signed)
Preoperative Diagnosis: postmenopausal bleeding  Postoperative Diagnosis: postmenopausal bleeding  Procedure: Hysteroscopy, dilation and curettage  Surgeon: Dr Sumner Boast  Assistants: None  Anesthesia: General via LMA  EBL: 3 cc  Fluids: 500 cc LR  Fluid deficit: 60 cc  Urine output: not recorded  Indications for surgery: The patient is a 56 yo female, who presented with postmenopausal bleeding. Work up included an ultrasound with a suspected endometrial polyp. The risks of the surgery were reviewed with the patient and the consent form was signed prior to her surgery.  Findings: EUA: small anteverted, mobile uterus, no adnexal masses. Hysteroscopy: polypoid tissue in the LUS/upper cervix, otherwise thin endometrium, normal tubal ostia bilaterally.  Specimens: endometrial vs endocervical polyp, ECC, EMC   Procedure: The patient was taken to the operating room with an IV in place. She was placed in the dorsal lithotomy position and anesthesia was administered. She was prepped and draped in the usual sterile fashion for a vaginal procedure. She voided on the way to the OR. A weighted speculum was placed in the vagina and a single tooth tenaculum was placed on the anterior lip of the cervix. The cervix was dilated to a #6 hagar dilator. The uterus was sounded to ~6 cm. The myosure hysteroscope was inserted into the uterine cavity. With continuous infusion of normal saline, the uterine cavity was visualized with the above findings. The myosure light was used to resect the polypoid tissue. The myosure was then removed. An ECC was obtained. The cavity was then curetted with the small sharp curette. The cavity had the characteristically gritty texture at the end of the procedure. The curette and the single tooth tenaculum were removed.  The speculum was removed. The patients perineum was cleansed of betadine and she was taken out of the dorsal lithotomy position.  Upon awakening the LMA was  removed and the patient was transferred to the recovery room in stable and awake condition.  The sponge and instrument count were correct.

## 2022-02-11 NOTE — Anesthesia Procedure Notes (Signed)
Procedure Name: LMA Insertion Date/Time: 02/11/2022 7:32 AM  Performed by: Rogers Blocker, CRNAPre-anesthesia Checklist: Patient identified, Emergency Drugs available, Suction available and Patient being monitored Patient Re-evaluated:Patient Re-evaluated prior to induction Oxygen Delivery Method: Circle System Utilized Preoxygenation: Pre-oxygenation with 100% oxygen Induction Type: IV induction Ventilation: Mask ventilation without difficulty LMA: LMA inserted LMA Size: 4.0 Number of attempts: 1 Placement Confirmation: positive ETCO2 Tube secured with: Tape Dental Injury: Teeth and Oropharynx as per pre-operative assessment

## 2022-02-11 NOTE — Transfer of Care (Signed)
Immediate Anesthesia Transfer of Care Note  Patient: Erika Robles  Procedure(s) Performed: DILATATION & CURETTAGE/HYSTEROSCOPY WITH MYOSURE (Uterus)  Patient Location: PACU  Anesthesia Type:General  Level of Consciousness: awake, alert , oriented and patient cooperative  Airway & Oxygen Therapy: Patient Spontanous Breathing  Post-op Assessment: Report given to RN and Post -op Vital signs reviewed and stable  Post vital signs: Reviewed and stable  Last Vitals:  Vitals Value Taken Time  BP    Temp    Pulse    Resp    SpO2      Last Pain:  Vitals:   02/11/22 0554  TempSrc: Oral  PainSc: 0-No pain      Patients Stated Pain Goal: 4 (82/64/15 8309)  Complications: No notable events documented.

## 2022-02-11 NOTE — Interval H&P Note (Signed)
History and Physical Interval Note:  02/11/2022 7:11 AM  Erika Robles  has presented today for surgery, with the diagnosis of postmenopausal bleeding.  The various methods of treatment have been discussed with the patient and family. After consideration of risks, benefits and other options for treatment, the patient has consented to  Procedure(s): Bradley (N/A) as a surgical intervention.  The patient's history has been reviewed, patient examined, no change in status, stable for surgery.  I have reviewed the patient's chart and labs.  Questions were answered to the patient's satisfaction.     Salvadore Dom

## 2022-02-11 NOTE — Anesthesia Postprocedure Evaluation (Signed)
Anesthesia Post Note  Patient: Erika Robles  Procedure(s) Performed: DILATATION & CURETTAGE/HYSTEROSCOPY WITH MYOSURE (Uterus)     Patient location during evaluation: PACU Anesthesia Type: General Level of consciousness: awake and alert Pain management: pain level controlled Vital Signs Assessment: post-procedure vital signs reviewed and stable Respiratory status: spontaneous breathing, nonlabored ventilation, respiratory function stable and patient connected to nasal cannula oxygen Cardiovascular status: blood pressure returned to baseline and stable Postop Assessment: no apparent nausea or vomiting Anesthetic complications: no   No notable events documented.  Last Vitals:  Vitals:   02/11/22 0815 02/11/22 0821  BP: (!) 127/51   Pulse: 66 65  Resp: 11 10  Temp:    SpO2: 100% 100%    Last Pain:  Vitals:   02/11/22 0820  TempSrc:   PainSc: 5                  Kirsi Hugh S

## 2022-02-11 NOTE — Discharge Instructions (Signed)
  DISCHARGE INSTRUCTIONS: HYSTEROSCOPY / ENDOMETRIAL ABLATION The following instructions have been prepared to help you care for yourself upon your return home.  May Remove Scop patch on or before  May take Ibuprofen after  May take stool softner while taking narcotic pain medication to prevent constipation.  Drink plenty of water.  Personal hygiene:  Use sanitary pads for vaginal drainage, not tampons.  Shower the day after your procedure.  NO tub baths, pools or Jacuzzis for 2-3 weeks.  Wipe front to back after using the bathroom.  Activity and limitations:  Do NOT drive or operate any equipment for 24 hours. The effects of anesthesia are still present and drowsiness may result.  Do NOT rest in bed all day.  Walking is encouraged.  Walk up and down stairs slowly.  You may resume your normal activity in one to two days or as indicated by your physician. Sexual activity: NO intercourse for at least 2 weeks after the procedure, or as indicated by your Doctor.  Diet: Eat a light meal as desired this evening. You may resume your usual diet tomorrow.  Return to Work: You may resume your work activities in one to two days or as indicated by Marine scientist.  What to expect after your surgery: Expect to have vaginal bleeding/discharge for 2-3 days and spotting for up to 10 days. It is not unusual to have soreness for up to 1-2 weeks. You may have a slight burning sensation when you urinate for the first day. Mild cramps may continue for a couple of days. You may have a regular period in 2-6 weeks.  Call your doctor for any of the following:  Excessive vaginal bleeding or clotting, saturating and changing one pad every hour.  Inability to urinate 6 hours after discharge from hospital.  Pain not relieved by pain medication.  Fever of 100.4 F or greater.  Unusual vaginal discharge or odor.     Post Anesthesia Home Care Instructions  Activity: Get plenty of rest for the remainder  of the day. A responsible individual must stay with you for 24 hours following the procedure.  For the next 24 hours, DO NOT: -Drive a car -Paediatric nurse -Drink alcoholic beverages -Take any medication unless instructed by your physician -Make any legal decisions or sign important papers.  Meals: Start with liquid foods such as gelatin or soup. Progress to regular foods as tolerated. Avoid greasy, spicy, heavy foods. If nausea and/or vomiting occur, drink only clear liquids until the nausea and/or vomiting subsides. Call your physician if vomiting continues.  Special Instructions/Symptoms: Your throat may feel dry or sore from the anesthesia or the breathing tube placed in your throat during surgery. If this causes discomfort, gargle with warm salt water. The discomfort should disappear within 24 hours.  Do not take any Tylenol until after 12:00 pm today, if needed. Do not take any nonsteroidal anti inflammatories until after 1:45 pm today, if needed.

## 2022-02-11 NOTE — Anesthesia Preprocedure Evaluation (Signed)
Anesthesia Evaluation  Patient identified by MRN, date of birth, ID band Patient awake    Reviewed: Allergy & Precautions, NPO status , Patient's Chart, lab work & pertinent test results  Airway Mallampati: II  TM Distance: >3 FB Neck ROM: Full    Dental no notable dental hx.    Pulmonary neg pulmonary ROS,    Pulmonary exam normal breath sounds clear to auscultation       Cardiovascular negative cardio ROS Normal cardiovascular exam Rhythm:Regular Rate:Normal     Neuro/Psych negative neurological ROS  negative psych ROS   GI/Hepatic negative GI ROS, Neg liver ROS,   Endo/Other  Hypothyroidism   Renal/GU negative Renal ROS  negative genitourinary   Musculoskeletal negative musculoskeletal ROS (+)   Abdominal   Peds negative pediatric ROS (+)  Hematology negative hematology ROS (+)   Anesthesia Other Findings   Reproductive/Obstetrics negative OB ROS                             Anesthesia Physical Anesthesia Plan  ASA: 2  Anesthesia Plan: General   Post-op Pain Management: Minimal or no pain anticipated   Induction: Intravenous  PONV Risk Score and Plan: 3 and Ondansetron, Dexamethasone and Treatment may vary due to age or medical condition  Airway Management Planned: LMA  Additional Equipment:   Intra-op Plan:   Post-operative Plan: Extubation in OR  Informed Consent: I have reviewed the patients History and Physical, chart, labs and discussed the procedure including the risks, benefits and alternatives for the proposed anesthesia with the patient or authorized representative who has indicated his/her understanding and acceptance.     Dental advisory given  Plan Discussed with: CRNA and Surgeon  Anesthesia Plan Comments:         Anesthesia Quick Evaluation

## 2022-02-12 ENCOUNTER — Encounter (HOSPITAL_BASED_OUTPATIENT_CLINIC_OR_DEPARTMENT_OTHER): Payer: Self-pay | Admitting: Obstetrics and Gynecology

## 2022-02-12 LAB — SURGICAL PATHOLOGY

## 2022-02-21 ENCOUNTER — Encounter: Payer: BC Managed Care – PPO | Admitting: Obstetrics and Gynecology

## 2022-02-25 ENCOUNTER — Encounter: Payer: Self-pay | Admitting: Obstetrics and Gynecology

## 2022-02-25 ENCOUNTER — Ambulatory Visit (INDEPENDENT_AMBULATORY_CARE_PROVIDER_SITE_OTHER): Payer: BC Managed Care – PPO | Admitting: Obstetrics and Gynecology

## 2022-02-25 VITALS — BP 110/74 | HR 66 | Wt 209.0 lb

## 2022-02-25 DIAGNOSIS — Z9889 Other specified postprocedural states: Secondary | ICD-10-CM | POA: Diagnosis not present

## 2022-02-25 NOTE — Progress Notes (Signed)
GYNECOLOGY  VISIT   HPI: 56 y.o.   Single Other or two or more races Not Hispanic or Latino  female   G1P1001 with No LMP recorded. (Menstrual status: Perimenopausal).   here for 2 week post op s/p hysteroscopy, D&C. Pathology was benign. She states that she saw blood 3 days after surgery but nothing since.   GYNECOLOGIC HISTORY: No LMP recorded. (Menstrual status: Perimenopausal). 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   Seasonal allergies 08/20/2015   UTI (lower urinary tract infection) 05/26/2012   Hypothyroid 01/07/2012   Hypercholesteremia 01/07/2012   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/2012   Morbid obesity with BMI of 45.0-49.9, adult ([place]) 01/07/2012    Past Medical History:  Diagnosis Date   Asthma    Chronic headaches    Hypercholesteremia    Hypothyroidism     Past Surgical History:  Procedure Laterality Date   BARTHOLIN CYST MARSUPIALIZATION     BREAST ENHANCEMENT SURGERY  2020   DILATATION & CURETTAGE/HYSTEROSCOPY WITH MYOSURE N/A 02/11/2022   Procedure: DILATATION & CURETTAGE/HYSTEROSCOPY WITH MYOSURE;  Surgeon: Salvadore Dom, MD;  Location: [facility];  Service: Gynecology;  Laterality: N/A;   paraesophageal hernia repair     SLEEVE GASTROPLASTY      Current Outpatient Medications  Medication Sig Dispense Refill   albuterol (PROVENTIL HFA;VENTOLIN HFA) 108 (90 BASE) MCG/ACT  inhaler Inhale 2 puffs into the lungs every 6 (six) hours as needed.     guaiFENesin (MUCINEX) 600 MG 12 hr tablet Take 600 mg by mouth 2 (two) times daily as needed for to loosen phlegm.     levothyroxine (SYNTHROID) 100 MCG tablet Take 1 tablet by mouth daily.     senna-docusate (SENOKOT-S) 8.6-50 MG tablet Take 1 tablet by mouth 2 (two) times daily.     valACYclovir (VALTREX) 500 MG tablet Take twice daily for 3-5 days as needed 30 tablet 12   loratadine (CLARITIN) 10 MG tablet Take 1 tablet by mouth daily. (Patient not taking: Reported on 02/25/2022)     No current facility-administered medications for this visit.     ALLERGIES: Atorvastatin  Family History  Problem Relation Age of Onset   Hypertension Mother    Diabetes Sister    Hypertension Sister    Colon polyps Father    Lung cancer Maternal Grandfather        smoker   Prostate cancer Paternal Grandfather     Social History   Socioeconomic History   Marital status: Single    Spouse name: Not on file   Number of children: 1   Years of education: Not on file  Highest education level: Not on file  Occupational History   Occupation: Best boy: [employer]  Tobacco Use   Smoking status: Never    Passive exposure: Past   Smokeless tobacco: Never   Tobacco comments:    Patient was around smokers in [place] Use   Vaping Use: Never used  Substance and Sexual Activity   Alcohol use: Yes    Comment: occ maybe once a month   Drug use: No   Sexual activity: Not Currently    Partners: Male    Birth control/protection: Pill  Other Topics Concern   Not on file  Social History Narrative   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 One caffeinated beverage daily. Updated 07/19/2013   Social Determinants of Health   Financial Resource Strain: Not on file  Food Insecurity: Not on file  Transportation Needs: Not on file  Physical Activity: Not on file  Stress: Not on file  Social  Connections: Not on file  Intimate Partner Violence: Not on file    Review of Systems  All other systems reviewed and are negative.   PHYSICAL EXAMINATION:    BP 110/74   Pulse 66   Wt 209 lb (94.8 kg)   SpO2 100%   BMI 34.78 kg/m     General appearance: alert, cooperative and appears stated age Abdomen: soft, non-tender; non distended, no masses,  no organomegaly  1. History of hysteroscopy Pathology was benign F/U for annual exam in 10/24

## 2022-06-27 ENCOUNTER — Other Ambulatory Visit (HOSPITAL_COMMUNITY): Payer: Self-pay

## 2022-06-27 MED ORDER — WEGOVY 0.25 MG/0.5ML ~~LOC~~ SOAJ
0.2500 mg | SUBCUTANEOUS | 11 refills | Status: DC
  Filled 2022-06-27: qty 2, 28d supply, fill #0

## 2022-07-02 ENCOUNTER — Other Ambulatory Visit (HOSPITAL_COMMUNITY): Payer: Self-pay

## 2023-01-29 ENCOUNTER — Ambulatory Visit: Payer: BC Managed Care – PPO | Admitting: Obstetrics and Gynecology

## 2023-05-14 ENCOUNTER — Ambulatory Visit (INDEPENDENT_AMBULATORY_CARE_PROVIDER_SITE_OTHER): Payer: 59 | Admitting: Radiology

## 2023-05-14 ENCOUNTER — Encounter: Payer: Self-pay | Admitting: Radiology

## 2023-05-14 VITALS — BP 120/84 | HR 81 | Ht 65.0 in | Wt 217.0 lb

## 2023-05-14 DIAGNOSIS — N958 Other specified menopausal and perimenopausal disorders: Secondary | ICD-10-CM | POA: Diagnosis not present

## 2023-05-14 DIAGNOSIS — Z01419 Encounter for gynecological examination (general) (routine) without abnormal findings: Secondary | ICD-10-CM | POA: Diagnosis not present

## 2023-05-14 MED ORDER — ESTRADIOL 0.1 MG/GM VA CREA: 1.0000 g | TOPICAL_CREAM | VAGINAL | 6 refills | Status: DC

## 2023-05-14 NOTE — Progress Notes (Signed)
   NYAMAL MIXSON 05-Jan-1966 [id number]   History: Postmenopausal 58 y.o. presents for annual exam.10/23 hysteroscopy, D&C, benign pathology. No further bleeding.   Gynecologic History Postmenopausal Last Pap: 01/22/22. Results were: ASCUS HPV negative Last mammogram: 04/2021. Results were: normal, scheduled 05/2023 Last colonoscopy: 08/26/2013   Obstetric History OB History  Gravida Para Term Preterm AB Living  1 1 1   1   SAB IAB Ectopic Multiple Live Births          # Outcome Date GA Lbr Len/2nd Weight Sex Type Anes PTL Lv  1 Term              The following portions of the patient's history were reviewed and updated as appropriate: allergies, current medications, past family history, past medical history, past social history, past surgical history, and problem list.  Review of Systems Pertinent items noted in HPI and remainder of comprehensive ROS otherwise negative.  Past medical history, past surgical history, family history and social history were all reviewed and documented in the EPIC chart.  Exam:  There were no vitals filed for this visit. There is no height or weight on file to calculate BMI.  General appearance:  Normal Thyroid:  Symmetrical, normal in size, without palpable masses or nodularity. Respiratory  Auscultation:  Clear without wheezing or rhonchi Cardiovascular  Auscultation:  Regular rate, without rubs, murmurs or gallops  Edema/varicosities:  Not grossly evident Abdominal  Soft,nontender, without masses, guarding or rebound.  Liver/spleen:  No organomegaly noted  Hernia:  None appreciated  Skin  Inspection:  Grossly normal Breasts: Examined lying and sitting. Reduction scars.  Right: Without masses, retractions, nipple discharge or axillary adenopathy.   Left: Without masses, retractions, nipple discharge or axillary adenopathy. Genitourinary   Inguinal/mons:  Normal without inguinal adenopathy  External genitalia:  Normal appearing vulva  with no masses, tenderness, or lesions  BUS/Urethra/Skene's glands:  Normal  Vagina: Atrophic. Normal appearing with normal color and discharge, no lesions.   Cervix:  Normal appearing without discharge or lesions  Uterus:  Normal in size, shape and contour.  Midline and mobile, nontender  Adnexa/parametria:     Rt: Normal in size, without masses or tenderness.   Lt: Normal in size, without masses or tenderness.  Anus and perineum: Normal    Raynelle Fanning, CMA present for exam  Assessment/Plan:   1. Well woman exam with routine gynecological exam (Primary) Pap 2026   2. Genitourinary syndrome of menopause - estradiol (ESTRACE VAGINAL) 0.1 MG/GM vaginal cream; Place 1 g vaginally 3 (three) times a week.  Dispense: 42.5 g; Refill: 6   Discussed SBE, colonoscopy and DEXA screening as directed. Recommend of exercise weekly, including weight bearing exercise.  Return in 1 year for annual or sooner prn.  Tanda Rockers WHNP-BC, 9:37 AM 05/14/2023

## 2023-05-14 NOTE — Patient Instructions (Signed)
Preventive Care 40-59 Years Old, Female Preventive care refers to lifestyle choices and visits with your health care provider that can promote health and wellness. Preventive care visits are also called wellness exams. What can I expect for my preventive care visit? Counseling Your health care provider may ask you questions about your: Medical history, including: Past medical problems. Family medical history. Pregnancy history. Current health, including: Menstrual cycle. Method of birth control. Emotional well-being. Home life and relationship well-being. Sexual activity and sexual health. Lifestyle, including: Alcohol, nicotine or tobacco, and drug use. Access to firearms. Diet, exercise, and sleep habits. Work and work environment. Sunscreen use. Safety issues such as seatbelt and bike helmet use. Physical exam Your health care provider will check your: Height and weight. These may be used to calculate your BMI (body mass index). BMI is a measurement that tells if you are at a healthy weight. Waist circumference. This measures the distance around your waistline. This measurement also tells if you are at a healthy weight and may help predict your risk of certain diseases, such as type 2 diabetes and high blood pressure. Heart rate and blood pressure. Body temperature. Skin for abnormal spots. What immunizations do I need?  Vaccines are usually given at various ages, according to a schedule. Your health care provider will recommend vaccines for you based on your age, medical history, and lifestyle or other factors, such as travel or where you work. What tests do I need? Screening Your health care provider may recommend screening tests for certain conditions. This may include: Lipid and cholesterol levels. Diabetes screening. This is done by checking your blood sugar (glucose) after you have not eaten for a while (fasting). Pelvic exam and Pap test. Hepatitis B test. Hepatitis C  test. HIV (human immunodeficiency virus) test. STI (sexually transmitted infection) testing, if you are at risk. Lung cancer screening. Colorectal cancer screening. Mammogram. Talk with your health care provider about when you should start having regular mammograms. This may depend on whether you have a family history of breast cancer. BRCA-related cancer screening. This may be done if you have a family history of breast, ovarian, tubal, or peritoneal cancers. Bone density scan. This is done to screen for osteoporosis. Talk with your health care provider about your test results, treatment options, and if necessary, the need for more tests. Follow these instructions at home: Eating and drinking  Eat a diet that includes fresh fruits and vegetables, whole grains, lean protein, and low-fat dairy products. Take vitamin and mineral supplements as recommended by your health care provider. Do not drink alcohol if: Your health care provider tells you not to drink. You are pregnant, may be pregnant, or are planning to become pregnant. If you drink alcohol: Limit how much you have to 0-1 drink a day. Know how much alcohol is in your drink. In the [place] one drink equals one 12 oz bottle of beer (355 mL), one 5 oz glass of wine (148 mL), or one 1 oz glass of hard liquor (44 mL). Lifestyle Brush your teeth every morning and night with fluoride toothpaste. Floss one time each day. Exercise for at least 30 minutes 5 or more days each week. Do not use any products that contain nicotine or tobacco. These products include cigarettes, chewing tobacco, and vaping devices, such as e-cigarettes. If you need help quitting, ask your health care provider. Do not use drugs. If you are sexually active, practice safe sex. Use a condom or other form of protection to   prevent STIs. If you do not wish to become pregnant, use a form of birth control. If you plan to become pregnant, see your health care provider for a  prepregnancy visit. Take aspirin only as told by your health care provider. Make sure that you understand how much to take and what form to take. Work with your health care provider to find out whether it is safe and beneficial for you to take aspirin daily. Find healthy ways to manage stress, such as: Meditation, yoga, or listening to music. Journaling. Talking to a trusted person. Spending time with friends and family. Minimize exposure to UV radiation to reduce your risk of skin cancer. Safety Always wear your seat belt while driving or riding in a vehicle. Do not drive: If you have been drinking alcohol. Do not ride with someone who has been drinking. When you are tired or distracted. While texting. If you have been using any mind-altering substances or drugs. Wear a helmet and other protective equipment during sports activities. If you have firearms in your house, make sure you follow all gun safety procedures. Seek help if you have been physically or sexually abused. What's next? Visit your health care provider once a year for an annual wellness visit. Ask your health care provider how often you should have your eyes and teeth checked. Stay up to date on all vaccines. This information is not intended to replace advice given to you by your health care provider. Make sure you discuss any questions you have with your health care provider. Document Revised: 10/03/2020 Document Reviewed: 10/03/2020 Elsevier Patient Education  2024 [employer] Inc[employer]  

## 2023-06-26 ENCOUNTER — Encounter: Payer: Self-pay | Admitting: Internal Medicine

## 2023-08-04 ENCOUNTER — Telehealth: Payer: Self-pay

## 2023-08-04 NOTE — Telephone Encounter (Signed)
 I do not think this will preclude [facility] procedure

## 2023-08-04 NOTE — Telephone Encounter (Signed)
 While preparing patient chart for pre-visit, RN noticed patient has history of reactive airway disease without complication. RN will contact Dr. Willy Harvest and Rogena Class, CRNA regarding patient appropriateness for colonoscopy procedure to be done at [facility] vs hospital setting.

## 2023-08-17 ENCOUNTER — Ambulatory Visit (AMBULATORY_SURGERY_CENTER): Payer: Self-pay

## 2023-08-17 VITALS — Ht 65.0 in | Wt 218.0 lb

## 2023-08-17 DIAGNOSIS — Z1211 Encounter for screening for malignant neoplasm of colon: Secondary | ICD-10-CM

## 2023-08-17 MED ORDER — NA SULFATE-K SULFATE-MG SULF 17.5-3.13-1.6 GM/177ML PO SOLN: 1.0000 | Freq: Once | ORAL | 0 refills | Status: AC

## 2023-08-17 NOTE — Progress Notes (Signed)
 No egg or soy allergy known to patient  No issues known to pt with past sedation with any surgeries or procedures Patient denies ever being told they had issues or difficulty with intubation  No FH of Malignant Hyperthermia Pt is not on diet pills Pt is not on  home 02  Pt is not on blood thinners  Pt has some issues with constipation and takes Miralax No A fib or A flutter Have any cardiac testing pending--no Pt can ambulate independently Pt denies use of chewing tobacco Discussed diabetic I weight loss medication holds Discussed NSAID holds Checked BMI Pt instructed to use Singlecare.com or GoodRx for a price reduction on prep  Patient's chart reviewed by Rogena Class CNRA prior to previsit and patient appropriate for the [facility].  Pre visit completed and red dot placed by patient's name on their procedure day (on provider's schedule).   Erika Robles

## 2023-09-01 ENCOUNTER — Encounter: Payer: Self-pay | Admitting: Internal Medicine

## 2023-09-17 ENCOUNTER — Encounter: Payer: Self-pay | Admitting: Internal Medicine

## 2023-09-22 ENCOUNTER — Encounter: Payer: Self-pay | Admitting: Internal Medicine

## 2024-03-08 ENCOUNTER — Ambulatory Visit (AMBULATORY_SURGERY_CENTER): Admitting: *Deleted

## 2024-03-08 VITALS — Ht 65.0 in | Wt 210.0 lb

## 2024-03-08 DIAGNOSIS — Z1211 Encounter for screening for malignant neoplasm of colon: Secondary | ICD-10-CM

## 2024-03-08 DIAGNOSIS — Z8371 Family history of adenomatous and serrated polyps: Secondary | ICD-10-CM

## 2024-03-08 MED ORDER — NA SULFATE-K SULFATE-MG SULF 17.5-3.13-1.6 GM/177ML PO SOLN: 1.0000 | Freq: Once | ORAL | 0 refills | Status: AC

## 2024-03-08 NOTE — Progress Notes (Signed)
 Pt's name and DOB verified at the beginning of the pre-visit with 2 identifiers  Pt denies any difficulty with ambulating,sitting, laying down or rolling side to side  Pt has no issues moving head neck or swallowing  No egg or soy allergy known to patient   No issues known to pt with past sedation  No FH of Malignant Hyperthermia  Pt is not on home 02   Pt is not on blood thinners   Pt has frequent issues with constipation RN instructed pt to use Miralax per bottles instructions a week before prep days. Pt states they will  Pt is not on dialysis  Pt denise any abnormal heart rhythms   Pt denies any upcoming cardiac testing  Patient's chart reviewed by Norleen Schillings CNRA prior to pre-visit and patient appropriate for the [facility].  Pre-visit completed and red dot placed by patient's name on their procedure day (on provider's schedule).    Visit in person  Pt states weight is 210 lb    Pt given  both [facility] main # and MD on call # prior to instructions.  Informed pt to come in at the time discussed and is shown on PV instructions.  Pt instructed to use Singlecare.com or GoodRx for a price reduction on prep  Instructed pt where to find PV instructions in My Ch. Copy of instructions given to pt Instructed pt on all aspects of written instructions including med holds clothing to wear and foods to eat and not eat as well as after procedure legal restrictions and to call MD on call if needed.. Pt states understanding. Instructed pt to review instructions again prior to procedure and call main # given if has any questions or any issues. Pt states they will.

## 2024-03-22 ENCOUNTER — Encounter: Payer: Self-pay | Admitting: Internal Medicine

## 2024-03-23 ENCOUNTER — Other Ambulatory Visit: Payer: Self-pay | Admitting: Internal Medicine

## 2024-03-23 DIAGNOSIS — Z8371 Family history of adenomatous and serrated polyps: Secondary | ICD-10-CM

## 2024-03-23 DIAGNOSIS — Z1211 Encounter for screening for malignant neoplasm of colon: Secondary | ICD-10-CM

## 2024-03-28 NOTE — Progress Notes (Unsigned)
 [facility] History and Physical   Primary Care Physician:  Leila Lucie LABOR, MD   Reason for Procedure:    Encounter Diagnosis  Name Primary?   Special screening for malignant neoplasms, colon Yes     Plan:    Colonoscopy   The patient was provided an opportunity to ask questions and all were answered. The patient agreed with the plan.   HPI: Erika Robles is a 58 y.o. female presenting for a screening colonoscopy.  There is a family history of colon polyps.  At least in her father.  No family history of colon cancer.  Severe diverticulosis on a colonoscopy for change in bowel habits 2015.  Otherwise negative.   Past Medical History:  Diagnosis Date   Asthma    exercise induced   Chronic headaches    Diverticulosis    Hypercholesteremia    Hypothyroidism     Past Surgical History:  Procedure Laterality Date   BARTHOLIN CYST MARSUPIALIZATION     BREAST ENHANCEMENT SURGERY  2020   COLONOSCOPY     DILATATION & CURETTAGE/HYSTEROSCOPY WITH MYOSURE N/A 02/11/2022   Procedure: DILATATION & CURETTAGE/HYSTEROSCOPY WITH MYOSURE;  Surgeon: Jannis Kate Norris, MD;  Location: [facility];  Service: Gynecology;  Laterality: N/A;   paraesophageal hernia repair     SLEEVE GASTROPLASTY       Current Outpatient Medications  Medication Sig Dispense Refill   levothyroxine (SYNTHROID) 100 MCG tablet Take 1 tablet by mouth daily. Currently taking 10     levothyroxine (SYNTHROID) 25 MCG tablet Take 25 mcg by mouth daily before breakfast.     loratadine (CLARITIN) 10 MG tablet Take 1 tablet by mouth daily.     albuterol (PROVENTIL HFA;VENTOLIN HFA) 108 (90 BASE) MCG/ACT inhaler Inhale 2 puffs into the lungs every 6 (six) hours as needed.     Cyanocobalamin (B-12) 50 MCG TABS  (Patient not taking: Reported on 03/08/2024)     estradiol  (ESTRACE  VAGINAL) 0.1 MG/GM vaginal cream Place 1 g vaginally 3 (three) times a week. (Patient taking differently: Place 1  g vaginally as needed.) 42.5 g 6   guaiFENesin (MUCINEX) 600 MG 12 hr tablet Take 600 mg by mouth 2 (two) times daily as needed for to loosen phlegm.     levothyroxine (SYNTHROID) 25 MCG tablet  (Patient not taking: Reported on 03/08/2024)     Multiple Vitamin (MULTIVITAMIN PO) Take by mouth.     Multiple Vitamins-Minerals (BARIATRIC MULTIVITAMINS PO)  (Patient not taking: Reported on 03/29/2024)     senna-docusate (SENOKOT-S) 8.6-50 MG tablet Take 1 tablet by mouth 2 (two) times daily. (Patient taking differently: Take 1 tablet by mouth as needed.)     valACYclovir  (VALTREX ) 500 MG tablet Take twice daily for 3-5 days as needed (Patient taking differently: as needed. Take twice daily for 3-5 days as needed) 30 tablet 12   Current Facility-Administered Medications  Medication Dose Route Frequency Provider Last Rate Last Admin   0.9 %  sodium chloride  infusion  500 mL Intravenous Continuous Avram Lupita BRAVO, MD        Allergies as of 03/29/2024 - Review Complete 03/29/2024  Allergen Reaction Noted   Atorvastatin Other (See Comments) 08/31/2015    Family History  Problem Relation Age of Onset   Hypertension Mother    Colon polyps Father    Diabetes Sister    Hypertension Sister    Lung cancer Maternal Grandfather        smoker   Prostate cancer  Paternal Grandfather    Colon cancer Neg Hx    Esophageal cancer Neg Hx    Rectal cancer Neg Hx    Stomach cancer Neg Hx     Social History   Socioeconomic History   Marital status: Single    Spouse name: Not on file   Number of children: 1   Years of education: Not on file   Highest education level: Not on file  Occupational History   Occupation: Event Organiser: [employer]  Tobacco Use   Smoking status: Never    Passive exposure: Past   Smokeless tobacco: Never   Tobacco comments:    Patient was around smokers in [place]   Vaping Use   Vaping status: Never Used  Substance and Sexual Activity   Alcohol use:  Yes    Comment: occ maybe once a month   Drug use: No   Sexual activity: Not Currently    Partners: Male    Birth control/protection: Post-menopausal    Comment: menarche 58yo, sexual debut 58yo  Other Topics Concern   Not on file  Social History Narrative   She is a production designer, theatre/television/film at a call center. She has 1 son. One caffeinated beverage daily. Updated 07/19/2013   Social Drivers of Health   Financial Resource Strain: Not on File (06/06/2022)   Received from [employer]    Financial Resource Strain: 0  Food Insecurity: Low Risk (07/23/2023)   Received from [facility]   Hunger Vital Sign    Within the past 12 months, you worried that your food would run out before you got money to buy more: Never true    Within the past 12 months, the food you bought just didn't last and you didn't have money to get more. : Never true  Transportation Needs: No Transportation Needs (07/23/2023)   Received from [facility]    In the past 12 months, has lack of reliable transportation kept you from medical appointments, meetings, work or from getting things needed for daily living? : No  Physical Activity: Not on File (06/06/2022)   Received from [facility]   Physical Activity    Physical Activity: 0  Stress: Not on File (06/06/2022)   Received from [facility]   Stress    Stress: 0  Social Connections: Not on File (01/17/2023)   Received from [facility]   Social Connections    Connectedness: 0  Intimate Partner Violence: Not on file    Review of Systems:  All other review of systems negative except as mentioned in the HPI.  Physical Exam: Vital signs BP (!) 194/87   Pulse 76   Temp (!) 97.2 F (36.2 C) (Temporal)   Ht 5' 5 (1.651 m)   Wt 210 lb (95.3 kg)   LMP 05/06/2012   SpO2 100%   BMI 34.95 kg/m   General:   Alert,  Well-developed, well-nourished, pleasant and cooperative in NAD Lungs:  Clear throughout to auscultation.   Heart:  Regular rate and rhythm; no  murmurs, clicks, rubs,  or gallops. Abdomen:  Soft, nontender and nondistended. Normal bowel sounds.   Neuro/Psych:  Alert and cooperative. Normal mood and affect. A and O x 3   @Yiannis Tulloch  CHARLENA Commander, MD, [facility] [phone] (pager) 03/29/2024 8:30 AM@

## 2024-03-29 ENCOUNTER — Encounter: Payer: Self-pay | Admitting: Internal Medicine

## 2024-03-29 ENCOUNTER — Ambulatory Visit: Admitting: Internal Medicine

## 2024-03-29 VITALS — BP 149/70 | HR 65 | Temp 97.2°F | Resp 14 | Ht 65.0 in | Wt 210.0 lb

## 2024-03-29 DIAGNOSIS — Z1211 Encounter for screening for malignant neoplasm of colon: Secondary | ICD-10-CM

## 2024-03-29 MED ORDER — SODIUM CHLORIDE 0.9 % IV SOLN: 500.0000 mL | INTRAVENOUS | Status: DC

## 2024-03-29 NOTE — Progress Notes (Signed)
 Transferred to PACU via stretcher.  Not responding to stimulation at this time.  VSS upon leaving procedure room.

## 2024-03-29 NOTE — Progress Notes (Signed)
 Pt's states no medical or surgical changes since previsit or office visit.

## 2024-03-29 NOTE — Patient Instructions (Addendum)
 No polyps seen again!  No cancer either!  You still have diverticulosis - thickened muscle rings and pouches in the colon wall. Please read the handout about this condition.  OK to try popcorn again.  Next routine colonoscopy or other screening test in 10 years - 2035.  I appreciate the opportunity to care for you. Lupita CHARLENA Commander, MD, [facility]  Discharge instructions given. Handout on Diverticulosis. Resume previous medications. YOU HAD AN ENDOSCOPIC PROCEDURE TODAY AT THE [facility]:   Refer to the procedure report that was given to you for any specific questions about what was found during the examination.  If the procedure report does not answer your questions, please call your gastroenterologist to clarify.  If you requested that your care partner not be given the details of your procedure findings, then the procedure report has been included in a sealed envelope for you to review at your convenience later.  YOU SHOULD EXPECT: Some feelings of bloating in the abdomen. Passage of more gas than usual.  Walking can help get rid of the air that was put into your GI tract during the procedure and reduce the bloating. If you had a lower endoscopy (such as a colonoscopy or flexible sigmoidoscopy) you may notice spotting of blood in your stool or on the toilet paper. If you underwent a bowel prep for your procedure, you may not have a normal bowel movement for a few days.  Please Note:  You might notice some irritation and congestion in your nose or some drainage.  This is from the oxygen used during your procedure.  There is no need for concern and it should clear up in a day or so.  SYMPTOMS TO REPORT IMMEDIATELY:  Following lower endoscopy (colonoscopy or flexible sigmoidoscopy):  Excessive amounts of blood in the stool  Significant tenderness or worsening of abdominal pains  Swelling of the abdomen that is new, acute  Fever of 100F or higher   For urgent or emergent issues, a  gastroenterologist can be reached at any hour by calling [phone]. Do not use MyChart messaging for urgent concerns.    DIET:  We do recommend a small meal at first, but then you may proceed to your regular diet.  Drink plenty of fluids but you should avoid alcoholic beverages for 24 hours.  ACTIVITY:  You should plan to take it easy for the rest of today and you should NOT DRIVE or use heavy machinery until tomorrow (because of the sedation medicines used during the test).    FOLLOW UP: Our staff will call the number listed on your records the next business day following your procedure.  We will call around 7:15- 8:00 am to check on you and address any questions or concerns that you may have regarding the information given to you following your procedure. If we do not reach you, we will leave a message.     If any biopsies were taken you will be contacted by phone or by letter within the next 1-3 weeks.  Please call us  at [phone] if you have not heard about the biopsies in 3 weeks.    SIGNATURES/CONFIDENTIALITY: You and/or your care partner have signed paperwork which will be entered into your electronic medical record.  These signatures attest to the fact that that the information above on your After Visit Summary has been reviewed and is understood.  Full responsibility of the confidentiality of this discharge information lies with you and/or your care-partner.

## 2024-03-29 NOTE — Op Note (Signed)
 [facility] Patient Name: Erika Robles Procedure Date: 03/29/2024 8:24 AM MRN: [id number] Endoscopist: Lupita FORBES Commander , MD, [id number] Age: 58 Referring MD:  Date of Birth: Nov 04, 1965 Gender: Female Account #: [id number] Procedure:                Colonoscopy Indications:              Screening for colorectal malignant neoplasm, Last                            colonoscopy: 2015 Medicines:                Monitored Anesthesia Care Procedure:                Pre-Anesthesia Assessment:                           - Prior to the procedure, a History and Physical                            was performed, and patient medications and                            allergies were reviewed. The patient's tolerance of                            previous anesthesia was also reviewed. The risks                            and benefits of the procedure and the sedation                            options and risks were discussed with the patient.                            All questions were answered, and informed consent                            was obtained. Prior Anticoagulants: The patient has                            taken no anticoagulant or antiplatelet agents. ASA                            Grade Assessment: II - A patient with mild systemic                            disease. After reviewing the risks and benefits,                            the patient was deemed in satisfactory condition to                            undergo the procedure.  After obtaining informed consent, the colonoscope                            was passed under direct vision. Throughout the                            procedure, the patient's blood pressure, pulse, and                            oxygen saturations were monitored continuously. The                            Olympus Scope SN: [id number] was introduced through                            the anus and advanced to the the  cecum, identified                            by appendiceal orifice and ileocecal valve. The                            colonoscopy was performed without difficulty. The                            patient tolerated the procedure well. The quality                            of the bowel preparation was good. The ileocecal                            valve, appendiceal orifice, and rectum were                            photographed. The bowel preparation used was SUPREP                            via split dose instruction. Scope In: 8:37:21 AM Scope Out: 8:48:03 AM Scope Withdrawal Time: 0 hours 8 minutes 21 seconds  Total Procedure Duration: 0 hours 10 minutes 42 seconds  Findings:                 The perianal and digital rectal examinations were                            normal.                           Multiple large-mouthed, medium-mouthed and                            small-mouthed diverticula were found in the sigmoid                            colon and descending colon. There was narrowing of  the colon in association with the diverticular                            opening.                           The exam was otherwise without abnormality on                            direct and retroflexion views. Complications:            No immediate complications. Estimated Blood Loss:     Estimated blood loss: none. Impression:               - Severe diverticulosis in the sigmoid colon and in                            the descending colon. There was narrowing of the                            colon in association with the diverticular opening.                           - The examination was otherwise normal on direct                            and retroflexion views.                           - No specimens collected. Recommendation:           - Patient has a contact number available for                            emergencies. The signs and symptoms of  potential                            delayed complications were discussed with the                            patient. Return to normal activities tomorrow.                            Written discharge instructions were provided to the                            patient.                           - Resume previous diet.                           - Continue present medications.                           - Repeat colonoscopy in 10 years for screening  purposes. Lupita FORBES Commander, MD 03/29/2024 8:58:08 AM This report has been signed electronically.

## 2024-03-30 ENCOUNTER — Telehealth: Payer: Self-pay

## 2024-03-30 NOTE — Telephone Encounter (Signed)
°  Follow up Call-     03/29/2024    8:07 AM  Call back number  Post procedure Call Back phone  # [phone]  Permission to leave phone message Yes     Patient questions:  Do you have a fever, pain , or abdominal swelling? No. Pain Score  0 *  Have you tolerated food without any problems? Yes.    Have you been able to return to your normal activities? Yes.    Do you have any questions about your discharge instructions: Diet   No. Medications  No. Follow up visit  No.  Do you have questions or concerns about your Care? No.  Actions: * If pain score is 4 or above: No action needed, pain <4.

## 2024-05-17 ENCOUNTER — Ambulatory Visit: Payer: 59 | Admitting: Radiology

## 2024-05-18 ENCOUNTER — Encounter: Payer: Self-pay | Admitting: Radiology

## 2024-05-18 ENCOUNTER — Other Ambulatory Visit (HOSPITAL_COMMUNITY)
Admission: RE | Admit: 2024-05-18 | Discharge: 2024-05-18 | Disposition: A | Source: Ambulatory Visit | Attending: Radiology | Admitting: Radiology

## 2024-05-18 ENCOUNTER — Ambulatory Visit: Admitting: Radiology

## 2024-05-18 VITALS — BP 136/82 | Ht 65.0 in | Wt 225.0 lb

## 2024-05-18 DIAGNOSIS — Z01419 Encounter for gynecological examination (general) (routine) without abnormal findings: Secondary | ICD-10-CM | POA: Diagnosis not present

## 2024-05-18 DIAGNOSIS — N958 Other specified menopausal and perimenopausal disorders: Secondary | ICD-10-CM | POA: Diagnosis not present

## 2024-05-18 DIAGNOSIS — Z1331 Encounter for screening for depression: Secondary | ICD-10-CM

## 2024-05-18 MED ORDER — ESTRADIOL 0.01 % VA CREA: 1.0000 g | TOPICAL_CREAM | VAGINAL | 6 refills | Status: AC

## 2024-05-18 NOTE — Progress Notes (Signed)
" ° °  Erika Robles 01-Oct-1965 [id number]   History: Postmenopausal 59 y.o. presents for annual exam.10/23 hysteroscopy, D&C, benign pathology. No further bleeding. Saw PCP earlier today.    Gynecologic History Postmenopausal Last Pap: 01/22/22. Results were: ASCUS HPV negative Last mammogram: 05/25/23. Results were: normal Last colonoscopy: 03/29/24     05/18/2024    2:39 PM 05/14/2023   10:07 AM  Depression screen PHQ 2/9  Decreased Interest 0 0  Down, Depressed, Hopeless 0 0  PHQ - 2 Score 0 0     Obstetric History OB History  Gravida Para Term Preterm AB Living  1 1 1   1   SAB IAB Ectopic Multiple Live Births          # Outcome Date GA Lbr Len/2nd Weight Sex Type Anes PTL Lv  1 Term              The following portions of the patient's history were reviewed and updated as appropriate: allergies, current medications, past family history, past medical history, past social history, past surgical history, and problem list.  Review of Systems Pertinent items noted in HPI and remainder of comprehensive ROS otherwise negative.  Past medical history, past surgical history, family history and social history were all reviewed and documented in the EPIC chart.  Exam:  Vitals:   05/18/24 1437  BP: 136/82  Weight: 225 lb (102.1 kg)  Height: 5' 5 (1.651 m)   Body mass index is 37.44 kg/m.  General appearance:  Normal Respiratory  Auscultation:  Clear without wheezing or rhonchi Cardiovascular  Auscultation:  Regular rate, without rubs, murmurs or gallops  Edema/varicosities:  Not grossly evident Abdominal  Soft,nontender, without masses, guarding or rebound.  Liver/spleen:  No organomegaly noted  Hernia:  None appreciated  Skin  Inspection:  Grossly normal Breasts: Examined lying and sitting. Reduction scars.  Right: Without masses, retractions, nipple discharge or axillary adenopathy.   Left: Without masses, retractions, nipple discharge or axillary  adenopathy. Genitourinary   Inguinal/mons:  Normal without inguinal adenopathy  External genitalia:  Normal appearing vulva with no masses, tenderness, or lesions  BUS/Urethra/Skene's glands:  Normal  Vagina: Atrophic. Normal appearing with normal color and discharge, no lesions.   Cervix:  Normal appearing without discharge or lesions  Uterus:  Normal in size, shape and contour.  Midline and mobile, nontender  Adnexa/parametria:     Rt: Normal in size, without masses or tenderness.   Lt: Normal in size, without masses or tenderness.  Anus and perineum: Normal    Darice Hoit, CMA present for exam  Assessment/Plan:   1. Well woman exam with routine gynecological exam (Primary) - Cytology - PAP(  [facility])  2. Genitourinary syndrome of menopause - estradiol  (ESTRACE ) 0.01 % CREA vaginal cream; Place 1 g vaginally 3 (three) times a week.  Dispense: 42.5 g; Refill: 6  3. Depression screening negative    Return in 1 year for annual or sooner prn.  Rayan Dyal B WHNP-BC, 2:43 PM 05/18/2024 "

## 2024-05-24 LAB — CYTOLOGY - PAP
Comment: NEGATIVE
Comment: NEGATIVE
Comment: NEGATIVE
Diagnosis: NEGATIVE
HPV 16: NEGATIVE
HPV 18 / 45: NEGATIVE
High risk HPV: POSITIVE — AB

## 2024-05-25 ENCOUNTER — Ambulatory Visit: Payer: Self-pay | Admitting: Radiology

## 2025-05-19 ENCOUNTER — Ambulatory Visit: Admitting: Radiology
